# Patient Record
Sex: Male | Born: 1935 | Race: White | Hispanic: No | Smoking: Never smoker
Health system: Southern US, Community
[De-identification: ages and names within clinical notes are randomized; demographics above are authoritative.]

## PROBLEM LIST (undated history)

## (undated) DIAGNOSIS — I10 Essential (primary) hypertension: Secondary | ICD-10-CM

## (undated) DIAGNOSIS — I2510 Atherosclerotic heart disease of native coronary artery without angina pectoris: Secondary | ICD-10-CM

---

## 2006-07-07 ENCOUNTER — Emergency Department: Payer: Self-pay | Admitting: Emergency Medicine

## 2006-07-07 ENCOUNTER — Other Ambulatory Visit: Payer: Self-pay

## 2006-09-29 DIAGNOSIS — G4730 Sleep apnea, unspecified: Secondary | ICD-10-CM | POA: Insufficient documentation

## 2006-10-04 ENCOUNTER — Ambulatory Visit: Payer: Self-pay | Admitting: Internal Medicine

## 2006-10-04 ENCOUNTER — Other Ambulatory Visit: Payer: Self-pay

## 2006-10-04 DIAGNOSIS — Z8679 Personal history of other diseases of the circulatory system: Secondary | ICD-10-CM | POA: Insufficient documentation

## 2021-04-12 ENCOUNTER — Inpatient Hospital Stay
Admission: EM | Admit: 2021-04-12 | Discharge: 2021-04-15 | DRG: 281 | Disposition: A | Discharge status: Home Health | Payer: No Typology Code available for payment source | Attending: Internal Medicine | Admitting: Internal Medicine

## 2021-04-12 ENCOUNTER — Emergency Department: Payer: No Typology Code available for payment source

## 2021-04-12 ENCOUNTER — Other Ambulatory Visit: Payer: Self-pay

## 2021-04-12 ENCOUNTER — Encounter: Payer: Self-pay | Admitting: Emergency Medicine

## 2021-04-12 DIAGNOSIS — R4182 Altered mental status, unspecified: Secondary | ICD-10-CM | POA: Diagnosis present

## 2021-04-12 DIAGNOSIS — I2582 Chronic total occlusion of coronary artery: Secondary | ICD-10-CM | POA: Diagnosis present

## 2021-04-12 DIAGNOSIS — W19XXXA Unspecified fall, initial encounter: Secondary | ICD-10-CM

## 2021-04-12 DIAGNOSIS — I452 Bifascicular block: Secondary | ICD-10-CM | POA: Diagnosis present

## 2021-04-12 DIAGNOSIS — Z6833 Body mass index (BMI) 33.0-33.9, adult: Secondary | ICD-10-CM | POA: Diagnosis not present

## 2021-04-12 DIAGNOSIS — I25118 Atherosclerotic heart disease of native coronary artery with other forms of angina pectoris: Secondary | ICD-10-CM | POA: Diagnosis not present

## 2021-04-12 DIAGNOSIS — E538 Deficiency of other specified B group vitamins: Secondary | ICD-10-CM | POA: Diagnosis present

## 2021-04-12 DIAGNOSIS — I10 Essential (primary) hypertension: Secondary | ICD-10-CM | POA: Diagnosis present

## 2021-04-12 DIAGNOSIS — E785 Hyperlipidemia, unspecified: Secondary | ICD-10-CM | POA: Diagnosis present

## 2021-04-12 DIAGNOSIS — I214 Non-ST elevation (NSTEMI) myocardial infarction: Secondary | ICD-10-CM | POA: Diagnosis present

## 2021-04-12 DIAGNOSIS — F0390 Unspecified dementia without behavioral disturbance: Secondary | ICD-10-CM | POA: Diagnosis present

## 2021-04-12 DIAGNOSIS — Z20822 Contact with and (suspected) exposure to covid-19: Secondary | ICD-10-CM | POA: Diagnosis present

## 2021-04-12 DIAGNOSIS — Z79899 Other long term (current) drug therapy: Secondary | ICD-10-CM | POA: Diagnosis not present

## 2021-04-12 DIAGNOSIS — Z7982 Long term (current) use of aspirin: Secondary | ICD-10-CM | POA: Diagnosis not present

## 2021-04-12 DIAGNOSIS — E669 Obesity, unspecified: Secondary | ICD-10-CM | POA: Diagnosis present

## 2021-04-12 DIAGNOSIS — R296 Repeated falls: Secondary | ICD-10-CM | POA: Diagnosis present

## 2021-04-12 DIAGNOSIS — I252 Old myocardial infarction: Secondary | ICD-10-CM

## 2021-04-12 DIAGNOSIS — I2510 Atherosclerotic heart disease of native coronary artery without angina pectoris: Secondary | ICD-10-CM | POA: Diagnosis present

## 2021-04-12 DIAGNOSIS — I25810 Atherosclerosis of coronary artery bypass graft(s) without angina pectoris: Secondary | ICD-10-CM | POA: Diagnosis present

## 2021-04-12 DIAGNOSIS — M6282 Rhabdomyolysis: Secondary | ICD-10-CM | POA: Diagnosis present

## 2021-04-12 HISTORY — DX: Essential (primary) hypertension: I10

## 2021-04-12 HISTORY — DX: Atherosclerotic heart disease of native coronary artery without angina pectoris: I25.10

## 2021-04-12 LAB — CBC
HCT: 47.7 % (ref 39.0–52.0)
HCT: 47.8 % (ref 39.0–52.0)
Hemoglobin: 15.8 g/dL (ref 13.0–17.0)
Hemoglobin: 15.6 g/dL (ref 13.0–17.0)
MCH: 29.9 pg (ref 26.0–34.0)
MCH: 29.3 pg (ref 26.0–34.0)
MCHC: 33.1 g/dL (ref 30.0–36.0)
MCHC: 32.6 g/dL (ref 30.0–36.0)
MCV: 90.3 fL (ref 80.0–100.0)
MCV: 89.7 fL (ref 80.0–100.0)
Platelets: 165 10*3/uL (ref 150–400)
Platelets: 212 10*3/uL (ref 150–400)
RBC: 5.28 MIL/uL (ref 4.22–5.81)
RBC: 5.33 MIL/uL (ref 4.22–5.81)
RDW: 13.4 % (ref 11.5–15.5)
RDW: 13.3 % (ref 11.5–15.5)
WBC: 19.5 10*3/uL — ABNORMAL HIGH (ref 4.0–10.5)
WBC: 19 10*3/uL — ABNORMAL HIGH (ref 4.0–10.5)
nRBC: 0 % (ref 0.0–0.2)
nRBC: 0 % (ref 0.0–0.2)

## 2021-04-12 LAB — COMPREHENSIVE METABOLIC PANEL
ALT: 27 U/L (ref 0–44)
AST: 53 U/L — ABNORMAL HIGH (ref 15–41)
Albumin: 3.8 g/dL (ref 3.5–5.0)
Alkaline Phosphatase: 69 U/L (ref 38–126)
Anion gap: 8 (ref 5–15)
BUN: 15 mg/dL (ref 8–23)
CO2: 24 mmol/L (ref 22–32)
Calcium: 8.8 mg/dL — ABNORMAL LOW (ref 8.9–10.3)
Chloride: 104 mmol/L (ref 98–111)
Creatinine, Ser: 1.05 mg/dL (ref 0.61–1.24)
GFR, Estimated: >60 mL/min (ref >60)
Glucose, Bld: 111 mg/dL — ABNORMAL HIGH (ref 70–99)
Potassium: 3.5 mmol/L (ref 3.5–5.1)
Sodium: 136 mmol/L (ref 135–145)
Total Bilirubin: 1.1 mg/dL (ref 0.3–1.2)
Total Protein: 7 g/dL (ref 6.5–8.1)

## 2021-04-12 LAB — RESP PANEL BY RT-PCR (FLU A&B, COVID) ARPGX2
Influenza A by PCR: NEGATIVE
Influenza B by PCR: NEGATIVE
SARS Coronavirus 2 by RT PCR: NEGATIVE

## 2021-04-12 LAB — HEPARIN LEVEL (UNFRACTIONATED): Heparin Unfractionated: 0.1 IU/mL — ABNORMAL LOW (ref 0.30–0.70)

## 2021-04-12 LAB — TSH: TSH: 1.288 u[IU]/mL (ref 0.350–4.500)

## 2021-04-12 LAB — URINALYSIS, COMPLETE (UACMP) WITH MICROSCOPIC
Bacteria, UA: NONE SEEN
Bilirubin Urine: NEGATIVE
Glucose, UA: NEGATIVE mg/dL
Ketones, ur: 20 mg/dL — AB
Leukocytes,Ua: NEGATIVE
Nitrite: NEGATIVE
Protein, ur: NEGATIVE mg/dL
Specific Gravity, Urine: 1.018 (ref 1.005–1.030)
pH: 5 (ref 5.0–8.0)

## 2021-04-12 LAB — TROPONIN I (HIGH SENSITIVITY)
Troponin I (High Sensitivity): 1858 ng/L (ref <18)
Troponin I (High Sensitivity): 3752 ng/L (ref <18)

## 2021-04-12 LAB — PROTIME-INR
INR: 1.1 (ref 0.8–1.2)
Prothrombin Time: 14.5 seconds (ref 11.4–15.2)

## 2021-04-12 LAB — APTT: aPTT: 31 seconds (ref 24–36)

## 2021-04-12 LAB — FOLATE: Folate: 17 ng/mL (ref >5.9)

## 2021-04-12 LAB — VITAMIN B12: Vitamin B-12: 156 pg/mL — ABNORMAL LOW (ref 180–914)

## 2021-04-12 LAB — MAGNESIUM: Magnesium: 2.3 mg/dL (ref 1.7–2.4)

## 2021-04-12 LAB — CK: Total CK: 1150 U/L — ABNORMAL HIGH (ref 49–397)

## 2021-04-12 MED ORDER — ASPIRIN 81 MG PO CHEW
81.0000 mg | CHEWABLE_TABLET | Freq: Every day | ORAL | Status: DC
  Administered 2021-04-13 – 2021-04-15 (×3): 81 mg via ORAL
  Filled 2021-04-12 (×3): qty 1

## 2021-04-12 MED ORDER — ONDANSETRON HCL 4 MG/2ML IJ SOLN: 4.0000 mg | Freq: Four times a day (QID) | INTRAMUSCULAR | Status: DC | PRN

## 2021-04-12 MED ORDER — ASPIRIN EC 81 MG PO TBEC: 81.0000 mg | DELAYED_RELEASE_TABLET | Freq: Every day | ORAL | Status: DC

## 2021-04-12 MED ORDER — ALLOPURINOL 100 MG PO TABS
200.0000 mg | ORAL_TABLET | Freq: Every day | ORAL | Status: DC
  Administered 2021-04-14 – 2021-04-15 (×2): 200 mg via ORAL
  Filled 2021-04-12 (×4): qty 2

## 2021-04-12 MED ORDER — NITROGLYCERIN 0.4 MG SL SUBL: 0.4000 mg | SUBLINGUAL_TABLET | SUBLINGUAL | Status: DC | PRN

## 2021-04-12 MED ORDER — HYDROCHLOROTHIAZIDE 12.5 MG PO TABS
12.5000 mg | ORAL_TABLET | Freq: Every day | ORAL | Status: DC
  Administered 2021-04-13: 12.5 mg via ORAL
  Filled 2021-04-12: qty 1

## 2021-04-12 MED ORDER — DORZOLAMIDE HCL-TIMOLOL MAL 2-0.5 % OP SOLN
1.0000 [drp] | Freq: Two times a day (BID) | OPHTHALMIC | Status: DC
  Administered 2021-04-14 – 2021-04-15 (×3): 1 [drp] via OPHTHALMIC
  Filled 2021-04-12 (×2): qty 10

## 2021-04-12 MED ORDER — LATANOPROST 0.005 % OP SOLN
1.0000 [drp] | Freq: Every day | OPHTHALMIC | Status: DC
  Administered 2021-04-13: 1 [drp] via OPHTHALMIC
  Filled 2021-04-12: qty 2.5

## 2021-04-12 MED ORDER — HEPARIN (PORCINE) 25000 UT/250ML-% IV SOLN
1700.0000 [IU]/h | INTRAVENOUS | Status: DC
  Administered 2021-04-12: 1400 [IU]/h via INTRAVENOUS
  Filled 2021-04-12 (×2): qty 250

## 2021-04-12 MED ORDER — LISINOPRIL 20 MG PO TABS
40.0000 mg | ORAL_TABLET | Freq: Every day | ORAL | Status: DC
  Administered 2021-04-13 – 2021-04-15 (×3): 40 mg via ORAL
  Filled 2021-04-12: qty 2
  Filled 2021-04-12: qty 4
  Filled 2021-04-12: qty 2

## 2021-04-12 MED ORDER — SODIUM CHLORIDE 0.9 % IV SOLN: INTRAVENOUS | Status: DC

## 2021-04-12 MED ORDER — METOPROLOL TARTRATE 50 MG PO TABS
100.0000 mg | ORAL_TABLET | Freq: Two times a day (BID) | ORAL | Status: DC
  Administered 2021-04-13 – 2021-04-15 (×5): 100 mg via ORAL
  Filled 2021-04-12 (×5): qty 2

## 2021-04-12 MED ORDER — HEPARIN BOLUS VIA INFUSION
3200.0000 [IU] | Freq: Once | INTRAVENOUS | Status: AC
  Administered 2021-04-12: 3200 [IU] via INTRAVENOUS
  Filled 2021-04-12: qty 3200

## 2021-04-12 MED ORDER — HEPARIN BOLUS VIA INFUSION
4000.0000 [IU] | Freq: Once | INTRAVENOUS | Status: AC
  Administered 2021-04-12: 4000 [IU] via INTRAVENOUS
  Filled 2021-04-12: qty 4000

## 2021-04-12 MED ORDER — ASPIRIN 81 MG PO CHEW
324.0000 mg | CHEWABLE_TABLET | ORAL | Status: AC
  Administered 2021-04-12: 324 mg via ORAL
  Filled 2021-04-12: qty 4

## 2021-04-12 MED ORDER — ACETAMINOPHEN 325 MG PO TABS: 650.0000 mg | ORAL_TABLET | ORAL | Status: DC | PRN

## 2021-04-12 MED ORDER — ATORVASTATIN CALCIUM 20 MG PO TABS: 40.0000 mg | ORAL_TABLET | Freq: Every day | ORAL | Status: DC

## 2021-04-12 MED ORDER — ASPIRIN 300 MG RE SUPP: 300.0000 mg | RECTAL | Status: AC

## 2021-04-12 NOTE — ED Provider Notes (Signed)
[facility] Emergency Department Provider Note  Time seen: 10:54 AM  I have reviewed the triage vital signs and the nursing notes.   HISTORY  Chief Complaint Fall   HPI Steven Jacobson is a 87 y.o. male presents to the emergency department for confusion and weakness over the past 2 to 3 days.  According to the wife who is here with the patient, patient lives at home, no diagnosed dementia.  She states normally the patient is very mentally sharp however over the past 2 to 3 days he has been somewhat confused and has been much more weak having trouble getting off the couch, etc.  1:00 this morning patient had a fall essentially from the couch to the floor and they were unable to get the patient up so they brought him to the emergency department for further evaluation.  Here the patient is awake alert but he is confused, states the year is 1992 but was able to tell me he is in the hospital as well as his name.  Patient denies any complaints, negative review of systems including chest pain shortness of breath abdominal pain vomiting diarrhea or dysuria.   History reviewed. No pertinent past medical history.  There are no problems to display for this patient.   Prior to Admission medications   Not on File    Not on File  No family history on file.  Social History    Review of Systems Constitutional: Negative for fever. Cardiovascular: Negative for chest pain. Respiratory: Negative for shortness of breath. Gastrointestinal: Negative for abdominal pain, vomiting and diarrhea. Genitourinary: Negative for urinary compaints Musculoskeletal: Negative for musculoskeletal complaints Skin: Negative for skin complaints  Neurological: Negative for headache All other ROS negative, although likely limited secondary to altered mental status.  ____________________________________________   PHYSICAL EXAM:  VITAL SIGNS: ED Triage Vitals [04/12/21 0728]  Enc Vitals Group      BP 139/76     Pulse Rate 67     Resp 18     Temp 97.6 F (36.4 C)     Temp Source Oral     SpO2 99 %     Weight 260 lb (117.9 kg)     Height 6\' 2"  (1.88 m)     Head Circumference      Peak Flow      Pain Score 0     Pain Loc      Pain Edu?      Excl. in GC?     Constitutional: Awake alert oriented to person and place only.  Cooperative and pleasant currently but appears confused. Eyes: Normal exam ENT      Head: Normocephalic and atraumatic.      Mouth/Throat: Mucous membranes are moist. Cardiovascular: Normal rate, regular rhythm.  Respiratory: Normal respiratory effort without tachypnea nor retractions. Breath sounds are clear Gastrointestinal: Soft and nontender. No distention. Musculoskeletal: Nontender with normal range of motion in all extremities. Neurologic:  Normal speech and language. No gross focal neurologic deficits  Skin:  Skin is warm, dry and intact.  Psychiatric: Mood and affect are normal.   ____________________________________________    EKG  EKG viewed and interpreted by myself shows a normal sinus rhythm at 69 bpm with a widened QRS, left axis deviation, largely normal intervals with nonspecific ST changes.  No recent EKG for comparison, last EKG was 2008 (and was grossly abnormal at that time).  ____________________________________________    RADIOLOGY  Chest x-ray shows cardiomegaly. CT scan of the  C-spine is negative. CT scan head is negative.  ____________________________________________   INITIAL IMPRESSION / ASSESSMENT AND PLAN / ED COURSE  Pertinent labs & imaging results that were available during my care of the patient were reviewed by me and considered in my medical decision making (see chart for details).   Patient presents to the emergency department for confusion and generalized weakness/fatigue over the past 2 to 3 days.  CT scans are negative for acute abnormality.  Chest x-ray negative for acute abnormality.  Patient's lab  work however does show significant leukocytosis of 19,000 as well as a troponin of 1800 and a CK of 1100.  Patient denies any chest pain, EKG shows what appears to be a bundle branch block but no signs of acute MI.  Patient's care is largely been provided at the [facility] hospital however patient and wife both strongly wishes stay at this hospital and do not want to be transferred to the [place].  Given the patient's elevated troponin we will start on a heparin infusion.  Given his elevated CK we will start on a normal saline infusion.  Urinalysis is pending, COVID is pending.  Patient will require admission to the hospital service for further work-up and treatment.  Demonie Kassa was evaluated in Emergency Department on 04/12/2021 for the symptoms described in the history of present illness. He was evaluated in the context of the global COVID-19 pandemic, which necessitated consideration that the patient might be at risk for infection with the SARS-CoV-2 virus that causes COVID-19. Institutional protocols and algorithms that pertain to the evaluation of patients at risk for COVID-19 are in a state of rapid change based on information released by regulatory bodies including the CDC and federal and state organizations. These policies and algorithms were followed during the patient's care in the ED.  CRITICAL CARE Performed by: Minna Antis   Total critical care time: 30 minutes  Critical care time was exclusive of separately billable procedures and treating other patients.  Critical care was necessary to treat or prevent imminent or life-threatening deterioration.  Critical care was time spent personally by me on the following activities: development of treatment plan with patient and/or surrogate as well as nursing, discussions with consultants, evaluation of patient's response to treatment, examination of patient, obtaining history from patient or surrogate, ordering and performing treatments and interventions,  ordering and review of laboratory studies, ordering and review of radiographic studies, pulse oximetry and re-evaluation of patient's condition.  ____________________________________________   FINAL CLINICAL IMPRESSION(S) / ED DIAGNOSES  Altered mental status NSTEMI   Minna Antis, MD 04/12/21 1059

## 2021-04-12 NOTE — ED Notes (Signed)
Pt with RA O2 sats 87-88%. Sats go up to 92-93% when encouraged to deep breath. Pt placed on 2LNC. Sats 97% at this time.

## 2021-04-12 NOTE — Consult Note (Signed)
Cardiology Consultation:   Patient ID: Quinnlan Abruzzo MRN: [id number]; DOB: [birth year]  Admit date: 04/12/2021 Date of Consult: 04/12/2021  PCP:  [facility]   [facility] Providers Cardiologist:  None       Patient Profile:   Jaray Boliver is a 87 y.o. male with a hx of CAD s/p CABG in 1996 who is being seen 04/12/2021 for the evaluation of an elevated troponin at the request of Dr. Joylene Igo.  History of Present Illness:   Mr. Vanosdol presents today for evaluation of a fall. He has a h/o CAD s/p CABG at the [facility] in 1996 after presenting with a "small heart atttack". He did not have angina at that time. He has done fairly well. He denies chest pain or sob or syncope. He fell earlier and had altered mentation and was brought in for evaluation and subsequent HS troponin was 800 then 3700. He denies angina and wants to go home. He is certain he did not pass out but was too weak to get up after falling to the ground. He is fairly sedentary.    History reviewed. No pertinent past medical history.  History reviewed. No pertinent surgical history.   Home Medications:  Prior to Admission medications   Medication Sig Start Date End Date Taking? Authorizing Provider  allopurinol (ZYLOPRIM) 100 MG tablet Take 200 mg by mouth daily. To lower uric acid levels and prevent gout 11/23/20  Yes [provider]  aspirin 81 MG chewable tablet Chew 81 mg by mouth daily. 10/05/06  Yes [provider]  atorvastatin (LIPITOR) 80 MG tablet Take 40 mg by mouth at bedtime. 11/23/20  Yes [provider]  dorzolamide-timolol (COSOPT) 22.3-6.8 MG/ML ophthalmic solution Place 1 drop into both eyes 2 (two) times daily. 10/18/20  Yes [provider]  hydrochlorothiazide (HYDRODIURIL) 25 MG tablet Take 12.5 mg by mouth daily. 11/23/20  Yes [provider]  latanoprost (XALATAN) 0.005 % ophthalmic solution Place 1 drop into both eyes at bedtime. 10/18/20  Yes [provider]  lisinopril (ZESTRIL) 40 MG tablet Take 40 mg by mouth daily. 11/23/20  Yes [provider]  metoprolol tartrate (LOPRESSOR) 100 MG tablet Take 100 mg by mouth every 12 (twelve) hours. 11/23/20  Yes [provider]  nitroGLYCERIN (NITRODUR - DOSED IN MG/24 HR) 0.6 mg/hr patch Place 1 patch onto the skin. 11/23/20  Yes [provider]    Inpatient Medications: Scheduled Meds:  allopurinol  200 mg Oral Daily   aspirin  324 mg Oral NOW   Or   aspirin  300 mg Rectal NOW   aspirin  81 mg Oral Daily   [START ON 04/13/2021] aspirin EC  81 mg Oral Daily   atorvastatin  40 mg Oral QHS   dorzolamide-timolol  1 drop Both Eyes BID   heparin  4,000 Units Intravenous Once   hydrochlorothiazide  12.5 mg Oral Daily   latanoprost  1 drop Both Eyes QHS   lisinopril  40 mg Oral Daily   metoprolol tartrate  100 mg Oral Q12H   Continuous Infusions:  sodium chloride     heparin 1,400 Units/hr (04/12/21 1219)   PRN Meds: acetaminophen, nitroGLYCERIN, ondansetron (ZOFRAN) IV  Allergies:   Not on File  Social History:   Social History   Socioeconomic History   Marital status: Married    Spouse name: Not on file   Number of children: Not on file   Years of education: Not on file  Highest education level: Not on file  Occupational History   Not on file  Tobacco Use   Smoking status: Not on file   Smokeless tobacco: Not on file  Substance and Sexual Activity   Alcohol use: Not on file   Drug use: Not on file   Sexual activity: Not on file  Other Topics Concern   Not on file  Social History Narrative   Not on file   Social Determinants of Health   Financial Resource Strain: Not on file  Food Insecurity: Not on file  Transportation Needs: Not on file  Physical Activity: Not on file  Stress: Not on file  Social Connections: Not on file  Intimate Partner Violence: Not on file    Family History:   History reviewed. No pertinent family history. No  premature CAD  ROS:  Please see the history of present illness.   All other ROS reviewed and negative.     Physical Exam/Data:   Vitals:   04/12/21 1216 04/12/21 1217 04/12/21 1218 04/12/21 1224  BP:      Pulse: (!) 58 (!) 59 61 62  Resp: 15 16 16 13   Temp:      TempSrc:      SpO2: (!) 87% (!) 88% (!) 89% 97%  Weight:      Height:       No intake or output data in the 24 hours ending 04/12/21 1255 Last 3 Weights 04/12/2021  Weight (lbs) 260 lb  Weight (kg) 117.935 kg     Body mass index is 33.38 kg/m.  General:  Well nourished, well developed, elderly man, in no acute distress HEENT: normal  Neck: no JVD Vascular: No carotid bruits; Distal pulses 2+ bilaterally Cardiac:  normal S1, S2; RRR; no murmur  Lungs:  clear to auscultation bilaterally, no wheezing, rhonchi or rales  Abd: soft, nontender, no hepatomegaly  Ext: no edema Musculoskeletal:  No deformities, BUE and BLE strength normal and equal Skin: warm and dry  Neuro:  CNs 2-12 intact, no focal abnormalities noted Psych:  Normal affect   EKG:  The EKG was personally reviewed and demonstrates:   Telemetry:  Telemetry was personally reviewed and demonstrates:  nsr with PVC's  Relevant CV Studies: none  Laboratory Data:  High Sensitivity Troponin:   Recent Labs  Lab 04/12/21 0815 04/12/21 1031  TROPONINIHS 1,858* 3,752*     Chemistry Recent Labs  Lab 04/12/21 0815 04/12/21 1031  NA 136  --   K 3.5  --   CL 104  --   CO2 24  --   GLUCOSE 111*  --   BUN 15  --   CREATININE 1.05  --   CALCIUM 8.8*  --   MG  --  2.3  GFRNONAA >60  --   ANIONGAP 8  --     Recent Labs  Lab 04/12/21 0815  PROT 7.0  ALBUMIN 3.8  AST 53*  ALT 27  ALKPHOS 69  BILITOT 1.1   Lipids No results for input(s): CHOL, TRIG, HDL, LABVLDL, LDLCALC, CHOLHDL in the last 168 hours.  Hematology Recent Labs  Lab 04/12/21 0815  WBC 19.5*  RBC 5.28  HGB 15.8  HCT 47.7  MCV 90.3  MCH 29.9  MCHC 33.1  RDW 13.4  PLT  165   Thyroid No results for input(s): TSH, FREET4 in the last 168 hours.  BNPNo results for input(s): BNP, PROBNP in the last 168 hours.  DDimer No results for input(s): DDIMER  in the last 168 hours.   Radiology/Studies:  CT HEAD WO CONTRAST ( )  Result Date: 04/12/2021 CLINICAL DATA:  Head trauma EXAM: CT HEAD WITHOUT CONTRAST TECHNIQUE: Contiguous axial images were obtained from the base of the skull through the vertex without intravenous contrast. COMPARISON:  None. FINDINGS: Brain: No acute intracranial hemorrhage, mass effect, or herniation. No extra-axial fluid collections. No evidence of acute territorial infarct. No hydrocephalus. Patchy hypodensities in the periventricular and subcortical white matter, likely secondary to chronic microvascular ischemic changes. Mild cortical volume loss. Vascular: Calcified plaques in the carotid siphons. Skull: Normal. Negative for fracture or focal lesion. Sinuses/Orbits: No acute finding. Other: None. IMPRESSION: Chronic changes with no acute intracranial process identified. Electronically Signed   By: Jannifer Hick M.D.   On: 04/12/2021 10:23   CT Cervical Spine Wo Contrast  Result Date: 04/12/2021 CLINICAL DATA:  Trauma. EXAM: CT CERVICAL SPINE WITHOUT CONTRAST TECHNIQUE: Multidetector CT imaging of the cervical spine was performed without intravenous contrast. Multiplanar CT image reconstructions were also generated. COMPARISON:  None. FINDINGS: Alignment: Normal. Skull base and vertebrae: No acute fracture. No primary bone lesion or focal pathologic process. Soft tissues and spinal canal: No prevertebral fluid or swelling. No visible canal hematoma. Disc levels: Mild intervertebral disc space narrowing at C5-C6 and C6-C7. Upper chest: Unremarkable. Other: None. IMPRESSION: No acute fracture or malalignment identified in the cervical spine. Electronically Signed   By: Jannifer Hick M.D.   On: 04/12/2021 10:25   DG Chest Portable 1  View  Result Date: 04/12/2021 CLINICAL DATA:  Fall.  NSTEMI. EXAM: PORTABLE CHEST 1 VIEW COMPARISON:  07/07/2006 FINDINGS: Cardiomegaly and CABG changes again noted. Elevation of the RIGHT hemidiaphragm is unchanged. There is no evidence of focal airspace disease, pulmonary edema, suspicious pulmonary nodule/mass, pleural effusion, or pneumothorax. No acute bony abnormalities are identified. IMPRESSION: Cardiomegaly without evidence of acute cardiopulmonary disease. Electronically Signed   By: Harmon Pier M.D.   On: 04/12/2021 10:01     Assessment and Plan:   NSTEMI - his troponins are elevated though he is asymptomatic. I would continue IV heparin. Probable left heart cath tomorrow. As he is asymptomatic, no indication for nitrates. Keep HR and BP controlled as needed with beta blocker. Repeat 12 lead ECG Altered mentation - he appears to be oriented now though he is quite cantankerous arguing constantly with his wife and son. Fall - cannot rule out either a brady or tachy mediated. Continue on tele.    Risk Assessment/Risk Scores:     TIMI Risk Score for Unstable Angina or Non-ST Elevation MI:   The patient's TIMI risk score is  , which indicates a  % risk of all cause mortality, new or recurrent myocardial infarction or need for urgent revascularization in the next 14 days.   For questions or updates, please contact [facility] Please consult [email or web address] for contact info under    Signed, Lewayne Bunting, MD  04/12/2021 12:55 PM

## 2021-04-12 NOTE — Progress Notes (Signed)
ANTICOAGULATION CONSULT NOTE   Pharmacy Consult for IV heparin Indication: chest pain/ACS   Patient Measurements: Height: 6\' 2"  (188 cm) Weight: 117.9 kg (260 lb) IBW/kg (Calculated) : 82.2 Heparin Dosing Weight: 107.3 kg  Vital Signs: Temp: 97.6 F (36.4 C) (11/24 0728) Temp Source: Oral (11/24 0728) BP: 139/76 (11/24 0728) Pulse Rate: 67 (11/24 0728)  Labs: Recent Labs    04/12/21 0815  HGB 15.8  HCT 47.7  PLT 165  CREATININE 1.05  CKTOTAL 1,150*  TROPONINIHS 1,858*    Estimated Creatinine Clearance: 70.2 mL/min (by C-G formula based on SCr of 1.05 mg/dL).   Medical History: History reviewed. No pertinent past medical history.  No PTA anticoagulation per my chart review  Assessment: 87YOM presenting d/t altered mental status and fall at home. No apparent pertinent PMH. EKG showing bundle branch block without signs of MI, though troponins elevated 1858 x 1.  CBC stable (Hgb 15.8, Plts WNL). No bleeding per notes.  Goal of Therapy:  Heparin level 0.3-0.7 units/ml Monitor platelets by anticoagulation protocol: Yes   Plan:  Give 4,000 units bolus x 1 Start heparin infusion at 1400 units/hr 8-hour heparin level CBC daily   04/14/21, PharmD Pharmacy Resident  04/12/2021 11:11 AM

## 2021-04-12 NOTE — ED Triage Notes (Signed)
Pt in via EMS for fall. WHen pt asked what he was here for, pt responded "I didn't call ya'll" Pt denies falling, pt denies pain. Pt denies all sx's.

## 2021-04-12 NOTE — H&P (Addendum)
History and Physical    Steven Jacobson [id number] DOB: 03/14/1934 DOA: 04/12/2021  PCP: [facility] [place]   Patient coming from: Home  I have personally briefly reviewed patient's old medical records in [place]  Chief Complaint: Status post fall   Most of the history is obtained from patient's wife at the bedside. 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  HPI: Steven Jacobson is a 87 y.o. male with medical history significant for pretension, coronary artery disease who was brought into the ER by EMS for evaluation after he fell. Patient is unable to provide any history and his wife states that over the last couple of days he has had frequent falls and has become progressively weak but early on the morning of his admission the patient fell off the couch and she was unable to help him up so she called EMS.  She states that he has not been diagnosed with dementia and that prior to 2 days ago was in his usual state of health with him being awake, alert and oriented to person place and time. Per EMS he was combative when they tried to get him on the stretcher. During my evaluation patient is only oriented to person and place but not to time.  He is able to move all extremities.  He has bruises on his extremities consistent with a history of falls. I am unable to do a review of systems on this patient. Labs show sodium 136, potassium 3.5, chloride 104, bicarb 24, glucose 111, BUN 15, creatinine 1.05, calcium 8.8, alkaline phosphatase 69, albumin 3.8, AST 53, ALT 27, total protein 7.0, total CK1 150, troponin 1858, white count 19.5, hemoglobin 15.8, hematocrit 47.7, MCV 90.3, RDW 13.4, platelet count 165 Respiratory viral panel is pending CT scan of the head without contrast shows chronic changes with no acute intracranial process. Cervical spine CT shows no acute fracture or malalignment identified in the cervical spine. Chest x-ray shows cardiomegaly without  evidence of acute cardiopulmonary disease. Twelve-lead EKG shows normal sinus rhythm with sinus arrhythmia.  Left axis deviation.  Right bundle branch block. No prior EKG to compare with  ED Course: Patient is an 87 year old male who was brought into the ER by EMS for evaluation after he fell at home and was unable to get up. Patient is confused and is only oriented to person and place which family states is a new problem.  At his baseline he is oriented to person, place and time. He has a white count of 19,000 with no obvious source at this time.  UA is pending Total CK is elevated and his troponin is also elevated as well. Twelve-lead EKG does not show any acute findings. He will be admitted to the hospital for further evaluation.   Review of Systems: As per HPI otherwise all other systems reviewed and negative.    History reviewed. No pertinent past medical history.  History reviewed. No pertinent surgical history.   has no history on file for tobacco use, alcohol use, and drug use.  Not on File  History reviewed. No pertinent family history.  Patient unable to provide history at this time  Prior to Admission medications   Not on File    Physical Exam: Vitals:   04/12/21 1216 04/12/21 1217 04/12/21 1218 04/12/21 1224  BP:      Pulse: (!) 58 (!) 59 61 62  Resp: 15 16 16 13   Temp:  TempSrc:      SpO2: (!) 87% (!) 88% (!) 89% 97%  Weight:      Height:         Vitals:   04/12/21 1216 04/12/21 1217 04/12/21 1218 04/12/21 1224  BP:      Pulse: (!) 58 (!) 59 61 62  Resp: 15 16 16 13   Temp:      TempSrc:      SpO2: (!) 87% (!) 88% (!) 89% 97%  Weight:      Height:          Constitutional: Alert and oriented x 3 . Not in any apparent distress HEENT:      Head: Normocephalic and atraumatic.         Eyes: PERLA, EOMI, Conjunctivae are normal. Sclera is non-icteric.       Mouth/Throat: Mucous membranes are moist.       Neck: Supple with no signs of  meningismus. Cardiovascular: Regular rate and rhythm. No murmurs, gallops, or rubs. 2+ symmetrical distal pulses are present . No JVD. No LE edema Respiratory: Respiratory effort normal .Lungs sounds clear bilaterally. No wheezes, crackles, or rhonchi.  Gastrointestinal: Soft, non tender, and non distended with positive bowel sounds.  Genitourinary: No CVA tenderness. Musculoskeletal: Nontender with normal range of motion in all extremities. No cyanosis, or erythema of extremities. Neurologic:  Face is symmetric. Moving all extremities. No gross focal neurologic deficits.  Able to move all extremities Skin: Skin is warm, dry.  No rash or ulcers Psychiatric: Mood and affect are normal.   Labs on Admission: I have personally reviewed following labs and imaging studies  CBC: Recent Labs  Lab 04/12/21 0815  WBC 19.5*  HGB 15.8  HCT 47.7  MCV 90.3  PLT 165   Basic Metabolic Panel: Recent Labs  Lab 04/12/21 0815 04/12/21 1031  NA 136  --   K 3.5  --   CL 104  --   CO2 24  --   GLUCOSE 111*  --   BUN 15  --   CREATININE 1.05  --   CALCIUM 8.8*  --   MG  --  2.3   GFR: Estimated Creatinine Clearance: 70.2 mL/min (by C-G formula based on SCr of 1.05 mg/dL). Liver Function Tests: Recent Labs  Lab 04/12/21 0815  AST 53*  ALT 27  ALKPHOS 69  BILITOT 1.1  PROT 7.0  ALBUMIN 3.8   No results for input(s): LIPASE, AMYLASE in the last 168 hours. No results for input(s): AMMONIA in the last 168 hours. Coagulation Profile: No results for input(s): INR, PROTIME in the last 168 hours. Cardiac Enzymes: Recent Labs  Lab 04/12/21 0815  CKTOTAL 1,150*   BNP (last 3 results) No results for input(s): PROBNP in the last 8760 hours. HbA1C: No results for input(s): HGBA1C in the last 72 hours. CBG: No results for input(s): GLUCAP in the last 168 hours. Lipid Profile: No results for input(s): CHOL, HDL, LDLCALC, TRIG, CHOLHDL, LDLDIRECT in the last 72 hours. Thyroid Function  Tests: No results for input(s): TSH, T4TOTAL, FREET4, T3FREE, THYROIDAB in the last 72 hours. Anemia Panel: No results for input(s): VITAMINB12, FOLATE, FERRITIN, TIBC, IRON, RETICCTPCT in the last 72 hours. Urine analysis:    Component Value Date/Time   COLORURINE YELLOW (A) 04/12/2021 1031   APPEARANCEUR HAZY (A) 04/12/2021 1031   LABSPEC 1.018 04/12/2021 1031   PHURINE 5.0 04/12/2021 1031   GLUCOSEU NEGATIVE 04/12/2021 1031   HGBUR MODERATE (A) 04/12/2021 1031  BILIRUBINUR NEGATIVE 04/12/2021 1031   KETONESUR 20 (A) 04/12/2021 1031   PROTEINUR NEGATIVE 04/12/2021 1031   NITRITE NEGATIVE 04/12/2021 1031   LEUKOCYTESUR NEGATIVE 04/12/2021 1031    Radiological Exams on Admission: CT HEAD WO CONTRAST (5MM)  Result Date: 04/12/2021 CLINICAL DATA:  Head trauma EXAM: CT HEAD WITHOUT CONTRAST TECHNIQUE: Contiguous axial images were obtained from the base of the skull through the vertex without intravenous contrast. COMPARISON:  None. FINDINGS: Brain: No acute intracranial hemorrhage, mass effect, or herniation. No extra-axial fluid collections. No evidence of acute territorial infarct. No hydrocephalus. Patchy hypodensities in the periventricular and subcortical white matter, likely secondary to chronic microvascular ischemic changes. Mild cortical volume loss. Vascular: Calcified plaques in the carotid siphons. Skull: Normal. Negative for fracture or focal lesion. Sinuses/Orbits: No acute finding. Other: None. IMPRESSION: Chronic changes with no acute intracranial process identified. Electronically Signed   By: Ofilia Neas M.D.   On: 04/12/2021 10:23   CT Cervical Spine Wo Contrast  Result Date: 04/12/2021 CLINICAL DATA:  Trauma. EXAM: CT CERVICAL SPINE WITHOUT CONTRAST TECHNIQUE: Multidetector CT imaging of the cervical spine was performed without intravenous contrast. Multiplanar CT image reconstructions were also generated. COMPARISON:  None. FINDINGS: Alignment: Normal. Skull base  and vertebrae: No acute fracture. No primary bone lesion or focal pathologic process. Soft tissues and spinal canal: No prevertebral fluid or swelling. No visible canal hematoma. Disc levels: Mild intervertebral disc space narrowing at C5-C6 and C6-C7. Upper chest: Unremarkable. Other: None. IMPRESSION: No acute fracture or malalignment identified in the cervical spine. Electronically Signed   By: Ofilia Neas M.D.   On: 04/12/2021 10:25   DG Chest Portable 1 View  Result Date: 04/12/2021 CLINICAL DATA:  Fall.  NSTEMI. EXAM: PORTABLE CHEST 1 VIEW COMPARISON:  07/07/2006 FINDINGS: Cardiomegaly and CABG changes again noted. Elevation of the RIGHT hemidiaphragm is unchanged. There is no evidence of focal airspace disease, pulmonary edema, suspicious pulmonary nodule/mass, pleural effusion, or pneumothorax. No acute bony abnormalities are identified. IMPRESSION: Cardiomegaly without evidence of acute cardiopulmonary disease. Electronically Signed   By: Margarette Canada M.D.   On: 04/12/2021 10:01     Assessment/Plan Principal Problem:   NSTEMI (non-ST elevated myocardial infarction) ([facility]) Active Problems:   CAD (coronary artery disease)   Rhabdomyolysis   Essential hypertension   AMS (altered mental status)   Frequent falls      Non-ST elevation MI Patient presents for evaluation of frequent falls and confusion and found to have elevated troponin levels He denies having any chest pain or shortness of breath Patient has a known history of coronary artery disease and is status post CABG Continue heparin drip initiated in the ER Continue aspirin, statins and metoprolol.    Rhabdomyolysis Following multiple falls We will start patient on normal saline Repeat CK levels in a.m.    Altered mental status Unclear etiology No source of infection at this time to explain patient's delirium Will obtain dementia work-up  Rule out CVA. Will repeat CT HEAD to rule out a stroke     Frequent  falls Place patient on fall precautions PT eval when stable    Leukocytosis Unclear etiology Possible leukemoid reaction      DVT prophylaxis: Heparin Code Status: full code  Family Communication: Greater than 50% of time was spent discussing patient's condition and plan of care with his wife and son at the bedside.  All questions and concerns have been addressed.  They verbalized understanding and agree with the plan.  CODE  STATUS was discussed and he is a full code. Disposition Plan: Back to previous home environment Consults called: Cardiology  Status:At the time of admission, it appears that the appropriate admission status for this patient is inpatient. This is judged to be reasonable and necessary to provide the required intensity of service to ensure the patient's safety given the presenting symptoms, physical exam findings, and initial radiographic and laboratory data in the context of their comorbid conditions. Patient requires inpatient status due to high intensity of service, high risk for further deterioration and high frequency of surveillance required.     Collier Bullock MD [facility]     04/12/2021, 12:27 PM

## 2021-04-12 NOTE — Progress Notes (Signed)
ANTICOAGULATION CONSULT NOTE   Pharmacy Consult for IV heparin Indication: chest pain/ACS   Patient Measurements: Height: 6\' 2"  (188 cm) Weight: 117.9 kg (260 lb) IBW/kg (Calculated) : 82.2 Heparin Dosing Weight: 107.3 kg  Vital Signs: BP: 123/68 (11/24 2200) Pulse Rate: 62 (11/24 2200)  Labs: Recent Labs    04/12/21 0815 04/12/21 1031 04/12/21 1210 04/12/21 1443 04/12/21 2000  HGB 15.8  --   --  15.6  --   HCT 47.7  --   --  47.8  --   PLT 165  --   --  212  --   APTT  --   --  31  --   --   LABPROT  --   --  14.5  --   --   INR  --   --  1.1  --   --   HEPARINUNFRC  --   --   --   --  0.10*  CREATININE 1.05  --   --   --   --   CKTOTAL 1,150*  --   --   --   --   TROPONINIHS 1,858* 3,752*  --   --   --      Estimated Creatinine Clearance: 70.2 mL/min (by C-G formula based on SCr of 1.05 mg/dL).   Medical History: History reviewed. No pertinent past medical history.  No PTA anticoagulation per my chart review  Assessment: 87YOM presenting d/t altered mental status and fall at home. No apparent pertinent PMH. EKG showing bundle branch block without signs of MI, though troponins elevated 1858 x 1.  CBC stable (Hgb 15.8, Plts WNL). No bleeding per notes.  Goal of Therapy:  Heparin level 0.3-0.7 units/ml Monitor platelets by anticoagulation protocol: Yes   Date/time HL Comment 11/24 2000 0.10 Rate 1400 units/hr. Will increase by 3unts/kg  Plan:  Give  3200 units bolus x 1 Increase heparin infusion to 1700 units/hr Repeat heparin level 8 hrs after rate change CBC daily whiel on heparin drip   Monico Sudduth Rodriguez-Guzman PharmD, BCPS 04/12/2021 10:37 PM

## 2021-04-12 NOTE — ED Triage Notes (Signed)
Pt in via EMS from home with c/o fall. EMS reports pt fell around 0100 this am. Pt is altered in mental status and EMS is unaware of baseline. Pt was combative when EMS was trying to get him on the stretcher. 167/59, 96% 74HR 99.0 temp, FSBS 123

## 2021-04-13 ENCOUNTER — Inpatient Hospital Stay (HOSPITAL_COMMUNITY)
Admit: 2021-04-13 | Discharge: 2021-04-13 | Disposition: A | Discharge status: Home / Self Care | Payer: No Typology Code available for payment source | Attending: Cardiovascular Disease | Admitting: Cardiovascular Disease

## 2021-04-13 ENCOUNTER — Encounter: Payer: Self-pay | Admitting: Internal Medicine

## 2021-04-13 ENCOUNTER — Other Ambulatory Visit: Payer: Self-pay | Admitting: Internal Medicine

## 2021-04-13 ENCOUNTER — Encounter
Admission: EM | Disposition: A | Discharge status: Home Health | Payer: Self-pay | Source: Home / Self Care | Attending: Internal Medicine

## 2021-04-13 ENCOUNTER — Inpatient Hospital Stay: Payer: No Typology Code available for payment source

## 2021-04-13 DIAGNOSIS — R4182 Altered mental status, unspecified: Secondary | ICD-10-CM | POA: Diagnosis not present

## 2021-04-13 DIAGNOSIS — I2510 Atherosclerotic heart disease of native coronary artery without angina pectoris: Secondary | ICD-10-CM | POA: Diagnosis not present

## 2021-04-13 DIAGNOSIS — M6282 Rhabdomyolysis: Secondary | ICD-10-CM

## 2021-04-13 DIAGNOSIS — I25810 Atherosclerosis of coronary artery bypass graft(s) without angina pectoris: Secondary | ICD-10-CM | POA: Diagnosis not present

## 2021-04-13 DIAGNOSIS — I25118 Atherosclerotic heart disease of native coronary artery with other forms of angina pectoris: Secondary | ICD-10-CM | POA: Diagnosis not present

## 2021-04-13 DIAGNOSIS — I214 Non-ST elevation (NSTEMI) myocardial infarction: Secondary | ICD-10-CM

## 2021-04-13 DIAGNOSIS — I10 Essential (primary) hypertension: Secondary | ICD-10-CM

## 2021-04-13 DIAGNOSIS — R296 Repeated falls: Secondary | ICD-10-CM | POA: Diagnosis not present

## 2021-04-13 HISTORY — PX: LEFT HEART CATH AND CORS/GRAFTS ANGIOGRAPHY: CATH118250

## 2021-04-13 LAB — ECHOCARDIOGRAM COMPLETE
AR max vel: 4.21 cm2
AV Area VTI: 4.53 cm2
AV Area mean vel: 4 cm2
AV Mean grad: 4 mmHg
AV Peak grad: 7.8 mmHg
Ao pk vel: 1.4 m/s
Area-P 1/2: 4.12 cm2
Height: 74 in
MV VTI: 5.1 cm2
S' Lateral: 4.5 cm
Weight: 4160 oz

## 2021-04-13 LAB — BASIC METABOLIC PANEL
Anion gap: 5 (ref 5–15)
BUN: 17 mg/dL (ref 8–23)
CO2: 26 mmol/L (ref 22–32)
Calcium: 8.4 mg/dL — ABNORMAL LOW (ref 8.9–10.3)
Chloride: 104 mmol/L (ref 98–111)
Creatinine, Ser: 1.05 mg/dL (ref 0.61–1.24)
GFR, Estimated: >60 mL/min (ref >60)
Glucose, Bld: 92 mg/dL (ref 70–99)
Potassium: 4.3 mmol/L (ref 3.5–5.1)
Sodium: 135 mmol/L (ref 135–145)

## 2021-04-13 LAB — CBC
HCT: 46.6 % (ref 39.0–52.0)
Hemoglobin: 15.8 g/dL (ref 13.0–17.0)
MCH: 30.6 pg (ref 26.0–34.0)
MCHC: 33.9 g/dL (ref 30.0–36.0)
MCV: 90.3 fL (ref 80.0–100.0)
Platelets: 188 10*3/uL (ref 150–400)
RBC: 5.16 MIL/uL (ref 4.22–5.81)
RDW: 13.5 % (ref 11.5–15.5)
WBC: 16.2 10*3/uL — ABNORMAL HIGH (ref 4.0–10.5)
nRBC: 0 % (ref 0.0–0.2)

## 2021-04-13 LAB — LIPID PANEL
Cholesterol: 111 mg/dL (ref 0–200)
HDL: 37 mg/dL — ABNORMAL LOW (ref >40)
LDL Cholesterol: 60 mg/dL (ref 0–99)
Total CHOL/HDL Ratio: 3 RATIO
Triglycerides: 71 mg/dL (ref <150)
VLDL: 14 mg/dL (ref 0–40)

## 2021-04-13 LAB — HEMOGLOBIN A1C
Hgb A1c MFr Bld: 5.6 % (ref 4.8–5.6)
Mean Plasma Glucose: 114 mg/dL

## 2021-04-13 LAB — CK: Total CK: 832 U/L — ABNORMAL HIGH (ref 49–397)

## 2021-04-13 LAB — HEPARIN LEVEL (UNFRACTIONATED): Heparin Unfractionated: 0.68 IU/mL (ref 0.30–0.70)

## 2021-04-13 LAB — RPR: RPR Ser Ql: NONREACTIVE

## 2021-04-13 LAB — TROPONIN I (HIGH SENSITIVITY): Troponin I (High Sensitivity): 2674 ng/L (ref <18)

## 2021-04-13 SURGERY — LEFT HEART CATH AND CORS/GRAFTS ANGIOGRAPHY
Anesthesia: Moderate Sedation

## 2021-04-13 MED ORDER — SODIUM CHLORIDE 0.9 % WEIGHT BASED INFUSION: 3.0000 mL/kg/h | INTRAVENOUS | Status: DC

## 2021-04-13 MED ORDER — HYDRALAZINE HCL 20 MG/ML IJ SOLN: 10.0000 mg | INTRAMUSCULAR | Status: AC | PRN

## 2021-04-13 MED ORDER — SODIUM CHLORIDE 0.9% FLUSH: 3.0000 mL | Freq: Two times a day (BID) | INTRAVENOUS | Status: DC

## 2021-04-13 MED ORDER — LABETALOL HCL 5 MG/ML IV SOLN: 10.0000 mg | INTRAVENOUS | Status: AC | PRN

## 2021-04-13 MED ORDER — PERFLUTREN LIPID MICROSPHERE
1.0000 mL | INTRAVENOUS | Status: AC | PRN
Start: 2021-04-13 — End: 2021-04-13
  Administered 2021-04-13: 4 mL via INTRAVENOUS
  Filled 2021-04-13: qty 10

## 2021-04-13 MED ORDER — MIDAZOLAM HCL 2 MG/2ML IJ SOLN
INTRAMUSCULAR | Status: AC
  Filled 2021-04-13: qty 2

## 2021-04-13 MED ORDER — FUROSEMIDE 40 MG PO TABS
40.0000 mg | ORAL_TABLET | Freq: Every day | ORAL | Status: DC
  Administered 2021-04-13 – 2021-04-15 (×3): 40 mg via ORAL
  Filled 2021-04-13 (×3): qty 1

## 2021-04-13 MED ORDER — SODIUM CHLORIDE 0.9 % WEIGHT BASED INFUSION: 1.0000 mL/kg/h | INTRAVENOUS | Status: DC

## 2021-04-13 MED ORDER — LIDOCAINE HCL (PF) 1 % IJ SOLN
INTRAMUSCULAR | Status: DC | PRN
  Administered 2021-04-13: 2 mL

## 2021-04-13 MED ORDER — SODIUM CHLORIDE 0.9% FLUSH: 3.0000 mL | INTRAVENOUS | Status: DC | PRN

## 2021-04-13 MED ORDER — CLOPIDOGREL BISULFATE 75 MG PO TABS
75.0000 mg | ORAL_TABLET | Freq: Every day | ORAL | Status: DC
  Administered 2021-04-14 – 2021-04-15 (×2): 75 mg via ORAL
  Filled 2021-04-13 (×2): qty 1

## 2021-04-13 MED ORDER — IOHEXOL 350 MG/ML SOLN
INTRAVENOUS | Status: DC | PRN
  Administered 2021-04-13: 24 mL

## 2021-04-13 MED ORDER — VERAPAMIL HCL 2.5 MG/ML IV SOLN
INTRAVENOUS | Status: DC | PRN
  Administered 2021-04-13: 2.5 mg via INTRA_ARTERIAL

## 2021-04-13 MED ORDER — HEPARIN (PORCINE) IN NACL 1000-0.9 UT/500ML-% IV SOLN
INTRAVENOUS | Status: DC | PRN
  Administered 2021-04-13: 1000 mL

## 2021-04-13 MED ORDER — SODIUM CHLORIDE 0.9 % IV SOLN: 250.0000 mL | INTRAVENOUS | Status: DC | PRN

## 2021-04-13 MED ORDER — HEPARIN SODIUM (PORCINE) 1000 UNIT/ML IJ SOLN
INTRAMUSCULAR | Status: AC
  Filled 2021-04-13: qty 10

## 2021-04-13 MED ORDER — HEPARIN (PORCINE) IN NACL 1000-0.9 UT/500ML-% IV SOLN
INTRAVENOUS | Status: AC
  Filled 2021-04-13: qty 1000

## 2021-04-13 MED ORDER — SODIUM CHLORIDE 0.9% FLUSH
3.0000 mL | Freq: Two times a day (BID) | INTRAVENOUS | Status: DC
  Administered 2021-04-13 – 2021-04-15 (×5): 3 mL via INTRAVENOUS

## 2021-04-13 MED ORDER — ENOXAPARIN SODIUM 40 MG/0.4ML IJ SOSY
40.0000 mg | PREFILLED_SYRINGE | INTRAMUSCULAR | Status: DC
  Administered 2021-04-14 – 2021-04-15 (×2): 40 mg via SUBCUTANEOUS
  Filled 2021-04-13 (×3): qty 0.4

## 2021-04-13 MED ORDER — CLOPIDOGREL BISULFATE 75 MG PO TABS
300.0000 mg | ORAL_TABLET | Freq: Once | ORAL | Status: AC
Start: 2021-04-13 — End: 2021-04-13
  Administered 2021-04-13: 300 mg via ORAL
  Filled 2021-04-13: qty 4

## 2021-04-13 MED ORDER — FENTANYL CITRATE (PF) 100 MCG/2ML IJ SOLN
INTRAMUSCULAR | Status: AC
  Filled 2021-04-13: qty 2

## 2021-04-13 MED ORDER — LIDOCAINE HCL 1 % IJ SOLN
INTRAMUSCULAR | Status: AC
  Filled 2021-04-13: qty 20

## 2021-04-13 MED ORDER — VERAPAMIL HCL 2.5 MG/ML IV SOLN
INTRAVENOUS | Status: AC
  Filled 2021-04-13: qty 2

## 2021-04-13 MED ORDER — CYANOCOBALAMIN 1000 MCG/ML IJ SOLN
1000.0000 ug | INTRAMUSCULAR | Status: DC
Start: 2021-04-13 — End: 2021-04-14
  Filled 2021-04-13: qty 1

## 2021-04-13 MED ORDER — HEPARIN SODIUM (PORCINE) 1000 UNIT/ML IJ SOLN
INTRAMUSCULAR | Status: DC | PRN
  Administered 2021-04-13: 5000 [IU] via INTRAVENOUS

## 2021-04-13 SURGICAL SUPPLY — 15 items
CATH INFINITI 5 FR IM (CATHETERS) ×2 IMPLANT
CATH INFINITI 5 FR MPA2 (CATHETERS) ×2 IMPLANT
CATH INFINITI 5FR AL1 (CATHETERS) ×2 IMPLANT
CATH INFINITI 5FR JL4 (CATHETERS) ×2 IMPLANT
DEVICE RAD TR BAND REGULAR (VASCULAR PRODUCTS) ×2 IMPLANT
DRAPE BRACHIAL (DRAPES) ×2 IMPLANT
GLIDESHEATH SLEND SS 6F .021 (SHEATH) ×2 IMPLANT
GUIDEWIRE INQWIRE 1.5J.035X260 (WIRE) ×1 IMPLANT
INQWIRE 1.5J .035X260CM (WIRE) ×2
PACK CARDIAC CATH (CUSTOM PROCEDURE TRAY) ×2 IMPLANT
PANNUS RETENTION SYSTEM 2 PAD (MISCELLANEOUS) ×2 IMPLANT
PROTECTION STATION PRESSURIZED (MISCELLANEOUS) ×2
SET ATX SIMPLICITY (MISCELLANEOUS) ×2 IMPLANT
STATION PROTECTION PRESSURIZED (MISCELLANEOUS) ×1 IMPLANT
WIRE HITORQ VERSACORE ST 145CM (WIRE) ×2 IMPLANT

## 2021-04-13 NOTE — Progress Notes (Signed)
Patient tolerated meal tray post-cath without incident. Tolerates fluids well. TR band removal successful per protocol and clean bandage with gauze applied to site on left radial. Patient is able void using external catheter system. Awaiting call from receiving RN and will transport to floor.

## 2021-04-13 NOTE — Progress Notes (Signed)
*  PRELIMINARY RESULTS* Echocardiogram 2D Echocardiogram has been performed.  Steven Jacobson 04/13/2021, 12:04 PM

## 2021-04-13 NOTE — ED Notes (Signed)
Report to Lea, RN  

## 2021-04-13 NOTE — ED Notes (Signed)
Called lab for morning labs. They came around at [facility] but pt was gone for scans. So I was letting them know he was back

## 2021-04-13 NOTE — Progress Notes (Signed)
ANTICOAGULATION CONSULT NOTE   Pharmacy Consult for IV heparin Indication: chest pain/ACS   Patient Measurements: Height: 6\' 2"  (188 cm) Weight: 117.9 kg (260 lb) IBW/kg (Calculated) : 82.2 Heparin Dosing Weight: 107.3 kg  Vital Signs: BP: 146/86 (11/25 1057) Pulse Rate: 60 (11/25 1057)  Labs: Recent Labs    04/12/21 0815 04/12/21 1031 04/12/21 1210 04/12/21 1443 04/12/21 2000 04/13/21 1041  HGB 15.8  --   --  15.6  --  15.8  HCT 47.7  --   --  47.8  --  46.6  PLT 165  --   --  212  --  188  APTT  --   --  31  --   --   --   LABPROT  --   --  14.5  --   --   --   INR  --   --  1.1  --   --   --   HEPARINUNFRC  --   --   --   --  0.10* 0.68  CREATININE 1.05  --   --   --   --  1.05  CKTOTAL 1,150*  --   --   --   --  832*  TROPONINIHS 1,858* 3,752*  --   --   --   --      Estimated Creatinine Clearance: 70.2 mL/min (by C-G formula based on SCr of 1.05 mg/dL).   Medical History: Past Medical History:  Diagnosis Date   CAD (coronary artery disease)     No PTA anticoagulation per my chart review  Assessment: 87YOM presenting d/t altered mental status and fall at home. No apparent pertinent PMH. EKG showing bundle branch block without signs of MI, though troponins elevated 1858 x 1.  CBC stable (Hgb 15.8, Plts WNL). No bleeding per notes.  Goal of Therapy:  Heparin level 0.3-0.7 units/ml Monitor platelets by anticoagulation protocol: Yes   Date/time HL Comment 11/24 2000 0.10 Rate 1400 units/hr. Will increase by 3unts/kg 11/25 1041 0.68 Therapeutic x 1  Plan:  Continue heparin infusion to 1700 units/hr Repeat heparin in 8 hrs to confirm results CBC daily whiel on heparin drip   Dorathy Stallone Rodriguez-Guzman PharmD, BCPS 04/13/2021 11:20 AM

## 2021-04-13 NOTE — H&P (View-Only) (Signed)
Progress Note  Patient Name: Steven Jacobson Date of Encounter: 04/13/2021  Primary Cardiologist: New to [facility]  Subjective   No chest pain, dyspnea, palpitations, dizziness, presyncope, or syncope.  Mental status at baseline.  High-sensitivity troponin has trended to 3752.  He remains on heparin drip.  N.p.o. for LHC today.  Consult note on 11/24 pending signature.  Inpatient Medications    Scheduled Meds:  allopurinol  200 mg Oral Daily   aspirin  81 mg Oral Daily   atorvastatin  40 mg Oral QHS   dorzolamide-timolol  1 drop Both Eyes BID   hydrochlorothiazide  12.5 mg Oral Daily   latanoprost  1 drop Both Eyes QHS   lisinopril  40 mg Oral Daily   metoprolol tartrate  100 mg Oral Q12H   Continuous Infusions:  sodium chloride Stopped (04/12/21 2125)   heparin 1,700 Units/hr (04/12/21 2332)   PRN Meds: acetaminophen, nitroGLYCERIN, ondansetron (ZOFRAN) IV   Vital Signs    Vitals:   04/13/21 0600 04/13/21 0630 04/13/21 0700 04/13/21 1057  BP: 128/67 126/68 131/67 (!) 146/86  Pulse: 60 (!) 43 (!) 55 60  Resp: 17 13 13 16   Temp:      TempSrc:      SpO2: 93% 96% 97% 97%  Weight:      Height:        Intake/Output Summary (Last 24 hours) at 04/13/2021 1109 Last data filed at 04/13/2021 0022 Gross per 24 hour  Intake 211.18 ml  Output 300 ml  Net -88.82 ml   Filed Weights   04/12/21 0728  Weight: 117.9 kg    Telemetry    SR - Personally Reviewed  ECG    Sinus bradycardia, 52 bpm, RBBB, left anterior fascicular block, baseline artifact, nonspecific ST-T changes- Personally Reviewed  Physical Exam   GEN: No acute distress.   Neck: No JVD. Cardiac: RRR, no murmurs, rubs, or gallops.  Left radial pulse 2+. Respiratory: Clear to auscultation bilaterally.  GI: Soft, nontender, non-distended.   MS: No edema; No deformity. Neuro:  Alert and oriented x 3; Nonfocal.  Psych: Normal affect.  Labs    Chemistry Recent Labs  Lab 04/12/21 0815  NA 136   K 3.5  CL 104  CO2 24  GLUCOSE 111*  BUN 15  CREATININE 1.05  CALCIUM 8.8*  PROT 7.0  ALBUMIN 3.8  AST 53*  ALT 27  ALKPHOS 69  BILITOT 1.1  GFRNONAA >60  ANIONGAP 8     Hematology Recent Labs  Lab 04/12/21 0815 04/12/21 1443 04/13/21 1041  WBC 19.5* 19.0* 16.2*  RBC 5.28 5.33 5.16  HGB 15.8 15.6 15.8  HCT 47.7 47.8 46.6  MCV 90.3 89.7 90.3  MCH 29.9 29.3 30.6  MCHC 33.1 32.6 33.9  RDW 13.4 13.3 13.5  PLT 165 212 188    Cardiac EnzymesNo results for input(s): TROPONINI in the last 168 hours. No results for input(s): TROPIPOC in the last 168 hours.   BNPNo results for input(s): BNP, PROBNP in the last 168 hours.   DDimer No results for input(s): DDIMER in the last 168 hours.   Radiology    CT HEAD WO CONTRAST (04/15/21)  Result Date: 04/13/2021 IMPRESSION: 1. No acute intracranial abnormalities. 2. Mild cerebral atrophy with chronic microvascular ischemic changes in the cerebral white matter, as above. Electronically Signed   By: 04/15/2021 M.D.   On: 04/13/2021 07:28   CT HEAD WO CONTRAST (04/15/2021)  Result Date: 04/12/2021 IMPRESSION: Chronic  changes with no acute intracranial process identified. Electronically Signed   By: Delaney  Williams M.D.   On: 04/12/2021 10:23   CT Cervical Spine Wo Contrast  Result Date: 04/12/2021 IMPRESSION: No acute fracture or malalignment identified in the cervical spine. Electronically Signed   By: Delaney  Williams M.D.   On: 04/12/2021 10:25   DG Chest Portable 1 View  Result Date: 04/12/2021 IMPRESSION: Cardiomegaly without evidence of acute cardiopulmonary disease. Electronically Signed   By: Jeffrey  Hu M.D.   On: 04/12/2021 10:01    Cardiac Studies   Nuclear treadmill stress test 03/1995: IMAGE ANALYSIS:  Left Ventricle Analysis :                           Rest           PeakEjection Fraction       52%            54%Wall Motion Analysis     - High Anterolateral  Normal         -     - Low Anterolateral   Normal          -     - Apical              Normal         -     - Posterobasal        Normal         -     - Inferior            Normal         -     - High Posterolateral Normal         Normal     - Low Posterolateral  Normal         Normal     - Inferoapical        Mild hypo      Normal     - Septal              Normal         Normal   Right Ventricle Analysis: Normal    Reason for Termination : Leg fatigue   EKG Interpretation: Negative for ST segment changes with an inadequate heart rate   Additional Comments: None   CONCLUSIONS: No exercise induced ischemia  __________  CABG 03/1995: Coronary Artery   Graft Quality   Graft Source  1 Dist RCA        Good            Saphenous vein  2 OM1             Good            Saphenous vein  3 Dist LAD        Good            L-IMA   Patient Profile     87 y.o. male with history of three-vessel CABG at [facility] in 03/1995 with LIMA to LAD, SVG to OM, and SVG to distal RCA who has not required ischemic evaluation since, HTN, and HLD who we are seeing for NSTEMI.  Assessment & Plan    1. CAD s/p 3-vessel CABG in 1996 with NSTEMI: -Currently, without angina -ASA -Heparin gtt -Echo pending -NPO for LHC today -Check lipid panel and A1c for further risk stratification  2.  HTN: -Blood pressure mildly elevated in the   ER -PTA Lopressor, lisinopril, HCTZ  3.  HLD: -Check LDL with goal being less than 70 -PTA atorvastatin  4.  Altered mental status: -Of uncertain etiology -Appears back to baseline at this time -Further management per primary service  5.  Leukocytosis: -No obvious source of infection at this time -Possibly inflammatory in the setting of the above -Could consider cultures -Further management per primary service  6.  Rhabdomyolysis: -Patient denies fall -CK pending -IV fluids    Shared Decision Making/Informed Consent{  The risks [stroke (1 in 1000), death (1 in 1000), kidney failure [usually temporary] (1 in  500), bleeding (1 in 200), allergic reaction [possibly serious] (1 in 200)], benefits (diagnostic support and management of coronary artery disease) and alternatives of a cardiac catheterization were discussed in detail with Steven Jacobson and he is willing to proceed.   For questions or updates, please contact [facility] Please consult [email or web address] for contact info under Cardiology/STEMI.    Signed, Sheketa Ende, PA-C [facility] Pager: [phone] 04/13/2021, 11:09 AM  

## 2021-04-13 NOTE — Progress Notes (Addendum)
Progress Note  Patient Name: Steven Jacobson Date of Encounter: 04/13/2021  Primary Cardiologist: New to [facility]  Subjective   No chest pain, dyspnea, palpitations, dizziness, presyncope, or syncope.  Mental status at baseline.  High-sensitivity troponin has trended to 3752.  He remains on heparin drip.  N.p.o. for LHC today.  Consult note on 11/24 pending signature.  Inpatient Medications    Scheduled Meds:  allopurinol  200 mg Oral Daily   aspirin  81 mg Oral Daily   atorvastatin  40 mg Oral QHS   dorzolamide-timolol  1 drop Both Eyes BID   hydrochlorothiazide  12.5 mg Oral Daily   latanoprost  1 drop Both Eyes QHS   lisinopril  40 mg Oral Daily   metoprolol tartrate  100 mg Oral Q12H   Continuous Infusions:  sodium chloride Stopped (04/12/21 2125)   heparin 1,700 Units/hr (04/12/21 2332)   PRN Meds: acetaminophen, nitroGLYCERIN, ondansetron (ZOFRAN) IV   Vital Signs    Vitals:   04/13/21 0600 04/13/21 0630 04/13/21 0700 04/13/21 1057  BP: 128/67 126/68 131/67 (!) 146/86  Pulse: 60 (!) 43 (!) 55 60  Resp: 17 13 13 16   Temp:      TempSrc:      SpO2: 93% 96% 97% 97%  Weight:      Height:        Intake/Output Summary (Last 24 hours) at 04/13/2021 1109 Last data filed at 04/13/2021 0022 Gross per 24 hour  Intake 211.18 ml  Output 300 ml  Net -88.82 ml   Filed Weights   04/12/21 0728  Weight: 117.9 kg    Telemetry    SR - Personally Reviewed  ECG    Sinus bradycardia, 52 bpm, RBBB, left anterior fascicular block, baseline artifact, nonspecific ST-T changes- Personally Reviewed  Physical Exam   GEN: No acute distress.   Neck: No JVD. Cardiac: RRR, no murmurs, rubs, or gallops.  Left radial pulse 2+. Respiratory: Clear to auscultation bilaterally.  GI: Soft, nontender, non-distended.   MS: No edema; No deformity. Neuro:  Alert and oriented x 3; Nonfocal.  Psych: Normal affect.  Labs    Chemistry Recent Labs  Lab 04/12/21 0815  NA 136   K 3.5  CL 104  CO2 24  GLUCOSE 111*  BUN 15  CREATININE 1.05  CALCIUM 8.8*  PROT 7.0  ALBUMIN 3.8  AST 53*  ALT 27  ALKPHOS 69  BILITOT 1.1  GFRNONAA >60  ANIONGAP 8     Hematology Recent Labs  Lab 04/12/21 0815 04/12/21 1443 04/13/21 1041  WBC 19.5* 19.0* 16.2*  RBC 5.28 5.33 5.16  HGB 15.8 15.6 15.8  HCT 47.7 47.8 46.6  MCV 90.3 89.7 90.3  MCH 29.9 29.3 30.6  MCHC 33.1 32.6 33.9  RDW 13.4 13.3 13.5  PLT 165 212 188    Cardiac EnzymesNo results for input(s): TROPONINI in the last 168 hours. No results for input(s): TROPIPOC in the last 168 hours.   BNPNo results for input(s): BNP, PROBNP in the last 168 hours.   DDimer No results for input(s): DDIMER in the last 168 hours.   Radiology    CT HEAD WO CONTRAST (04/15/21)  Result Date: 04/13/2021 IMPRESSION: 1. No acute intracranial abnormalities. 2. Mild cerebral atrophy with chronic microvascular ischemic changes in the cerebral white matter, as above. Electronically Signed   By: 04/15/2021 M.D.   On: 04/13/2021 07:28   CT HEAD WO CONTRAST (04/15/2021)  Result Date: 04/12/2021 IMPRESSION: Chronic  changes with no acute intracranial process identified. Electronically Signed   By: Jannifer Hick M.D.   On: 04/12/2021 10:23   CT Cervical Spine Wo Contrast  Result Date: 04/12/2021 IMPRESSION: No acute fracture or malalignment identified in the cervical spine. Electronically Signed   By: Jannifer Hick M.D.   On: 04/12/2021 10:25   DG Chest Portable 1 View  Result Date: 04/12/2021 IMPRESSION: Cardiomegaly without evidence of acute cardiopulmonary disease. Electronically Signed   By: Harmon Pier M.D.   On: 04/12/2021 10:01    Cardiac Studies   Nuclear treadmill stress test 03/1995: IMAGE ANALYSIS:  Left Ventricle Analysis :                           Rest           PeakEjection Fraction       52%            54%Wall Motion Analysis     - High Anterolateral  Normal         -     - Low Anterolateral   Normal          -     - Apical              Normal         -     - Posterobasal        Normal         -     - Inferior            Normal         -     - High Posterolateral Normal         Normal     - Low Posterolateral  Normal         Normal     - Inferoapical        Mild hypo      Normal     - Septal              Normal         Normal   Right Ventricle Analysis: Normal    Reason for Termination : Leg fatigue   EKG Interpretation: Negative for ST segment changes with an inadequate heart rate   Additional Comments: None   CONCLUSIONS: No exercise induced ischemia  __________  CABG 03/1995: Coronary Artery   Graft Quality   Graft Source  1 Dist RCA        Good            Saphenous vein  2 OM1             Good            Saphenous vein  3 Dist LAD        Good            L-IMA   Patient Profile     87 y.o. male with history of three-vessel CABG at [facility] in 03/1995 with LIMA to LAD, SVG to OM, and SVG to distal RCA who has not required ischemic evaluation since, HTN, and HLD who we are seeing for NSTEMI.  Assessment & Plan    1. CAD s/p 3-vessel CABG in 1996 with NSTEMI: -Currently, without angina -ASA -Heparin gtt -Echo pending -NPO for LHC today -Check lipid panel and A1c for further risk stratification  2.  HTN: -Blood pressure mildly elevated in the  ER -PTA Lopressor, lisinopril, HCTZ  3.  HLD: -Check LDL with goal being less than 70 -PTA atorvastatin  4.  Altered mental status: -Of uncertain etiology -Appears back to baseline at this time -Further management per primary service  5.  Leukocytosis: -No obvious source of infection at this time -Possibly inflammatory in the setting of the above -Could consider cultures -Further management per primary service  6.  Rhabdomyolysis: -Patient denies fall -CK pending -IV fluids    Shared Decision Making/Informed Consent{  The risks [stroke (1 in 1000), death (1 in 1000), kidney failure [usually temporary] (1 in  500), bleeding (1 in 200), allergic reaction [possibly serious] (1 in 200)], benefits (diagnostic support and management of coronary artery disease) and alternatives of a cardiac catheterization were discussed in detail with [name] and he is willing to proceed.   For questions or updates, please contact [facility] Please consult [email or web address] for contact info under Cardiology/STEMI.    Signed, Eula Listen, PA-C [facility] Pager: [phone] 04/13/2021, 11:09 AM

## 2021-04-13 NOTE — Interval H&P Note (Signed)
History and Physical Interval Note:  04/13/2021 12:42 PM  Steven Jacobson  has presented today for surgery, with the diagnosis of Non-ST segment myocardial infarction.  The various methods of treatment have been discussed with the patient and family. After consideration of risks, benefits and other options for treatment, the patient has consented to  Procedure(s): LEFT HEART CATH AND CORS/GRAFTS ANGIOGRAPHY (N/A) as a surgical intervention.  The patient's history has been reviewed, patient examined, no change in status, stable for surgery.  I have reviewed the patient's chart and labs.  Questions were answered to the patient's satisfaction.    Cath Lab Visit (complete for each Cath Lab visit)  Clinical Evaluation Leading to the Procedure:   ACS: Yes.    Non-ACS:  N/A  [name]

## 2021-04-13 NOTE — ED Notes (Signed)
Rn to bedside to introduce self to pt. Pt in no acute distress.

## 2021-04-13 NOTE — Progress Notes (Signed)
Patient transported via bed to room 256. Patient is inquiring about his clothing that he came in with on EMS. Called the ED and spoke with Marylene Land, Consulting civil engineer. She will look around the room he came from and if nothing is located she will attempt to call EMS and see if they can locate or remember what the disposition of clothing is. Relayed this information to [employer], RN and Judeth Cornfield, Charity fundraiser on 2a.

## 2021-04-13 NOTE — ED Notes (Signed)
Called lab again. Lab never came and drew labs.

## 2021-04-13 NOTE — ED Notes (Signed)
Lab at bedside

## 2021-04-13 NOTE — Progress Notes (Signed)
Progress Note    Steven Jacobson  JHH:[id number] DOB: [birth year]  DOA: 04/12/2021 PCP: [facility]      Brief Narrative:    Medical records reviewed and are as summarized below:  Steven Jacobson is a 87 y.o. male with medical history significant for CAD s/p CABG, hypertension, who was brought to the hospital because of frequent falls at home.      Assessment/Plan:   Principal Problem:   NSTEMI (non-ST elevated myocardial infarction) (HCC) Active Problems:   CAD (coronary artery disease)   Rhabdomyolysis   Essential hypertension   AMS (altered mental status)   Frequent falls   Body mass index is 33.38 kg/m.  (Obesity)   Acute NSTEMI, CAD with history of CABG: Maximum troponin was 3,752.  S/p left heart cath which showed severe three-vessel CAD.  Medical therapy with dual antiplatelet therapy was recommended.  No coronary stent was placed.  He is off of IV heparin.  Vitamin B12 deficiency: Vitamin B12 level was 156.  Start vitamin B12 injections  Elevated CK: Probably from acute MI.  Frequent falls: PT and OT evaluation   Diet Order             Diet Heart Room service appropriate? Yes; Fluid consistency: Thin  Diet effective now                      Consultants: Cardiologist  Procedures: Left heart cath    Medications:    [MAR Hold] allopurinol  200 mg Oral Daily   [MAR Hold] aspirin  81 mg Oral Daily   [MAR Hold] atorvastatin  40 mg Oral QHS   clopidogrel  300 mg Oral Once   [START ON 04/14/2021] clopidogrel  75 mg Oral Q breakfast   [MAR Hold] cyanocobalamin  1,000 mcg Subcutaneous Once per day on Mon Wed Fri   [facility] Hold] dorzolamide-timolol  1 drop Both Eyes BID   [START ON 04/14/2021] enoxaparin (LOVENOX) injection  40 mg Subcutaneous Q24H   [MAR Hold] latanoprost  1 drop Both Eyes QHS   [MAR Hold] lisinopril  40 mg Oral Daily   [MAR Hold] metoprolol tartrate  100 mg Oral Q12H   sodium chloride flush  3 mL Intravenous Q12H    sodium chloride flush  3 mL Intravenous Q12H   Continuous Infusions:  sodium chloride     sodium chloride     [START ON 04/14/2021] sodium chloride     Followed by   Melene Muller ON 04/14/2021] sodium chloride       Anti-infectives (From admission, onward)    None              Family Communication/Anticipated D/C date and plan/Code Status   DVT prophylaxis: enoxaparin (LOVENOX) injection 40 mg Start: 04/14/21 1000     Code Status: Full Code  Family Communication: None Disposition Plan: Plan to discharge home in 1 to 2 days   Status is: Inpatient  Remains inpatient appropriate because: NSTEMI           Subjective:   Interval events noted.  He was seen in the hallway in the emergency room.  No chest pain or shortness of breath.  Objective:    Vitals:   04/13/21 1057 04/13/21 1205 04/13/21 1233 04/13/21 1350  BP: (!) 146/86 135/72  131/74  Pulse: 60 69  (!) 48  Resp: 16 14  14   Temp:  97.6 F (36.4 C)    TempSrc:  Oral  SpO2: 97% 95% 95% 100%  Weight:      Height:       No data found.   Intake/Output Summary (Last 24 hours) at 04/13/2021 1403 Last data filed at 04/13/2021 0022 Gross per 24 hour  Intake 211.18 ml  Output 300 ml  Net -88.82 ml   Filed Weights   04/12/21 0728  Weight: 117.9 kg    Exam:  GEN: NAD SKIN: No rash EYES: EOMI ENT: MMM CV: RRR PULM: CTA B ABD: soft, ND, NT, +BS CNS: AAO x 3, non focal EXT: No edema or tenderness        Data Reviewed:   I have personally reviewed following labs and imaging studies:  Labs: Labs show the following:   Basic Metabolic Panel: Recent Labs  Lab 04/12/21 0815 04/12/21 1031 04/13/21 1041  NA 136  --  135  K 3.5  --  4.3  CL 104  --  104  CO2 24  --  26  GLUCOSE 111*  --  92  BUN 15  --  17  CREATININE 1.05  --  1.05  CALCIUM 8.8*  --  8.4*  MG  --  2.3  --    GFR Estimated Creatinine Clearance: 70.2 mL/min (by C-G formula based on SCr of 1.05  mg/dL). Liver Function Tests: Recent Labs  Lab 04/12/21 0815  AST 53*  ALT 27  ALKPHOS 69  BILITOT 1.1  PROT 7.0  ALBUMIN 3.8   No results for input(s): LIPASE, AMYLASE in the last 168 hours. No results for input(s): AMMONIA in the last 168 hours. Coagulation profile Recent Labs  Lab 04/12/21 1210  INR 1.1    CBC: Recent Labs  Lab 04/12/21 0815 04/12/21 1443 04/13/21 1041  WBC 19.5* 19.0* 16.2*  HGB 15.8 15.6 15.8  HCT 47.7 47.8 46.6  MCV 90.3 89.7 90.3  PLT 165 212 188   Cardiac Enzymes: Recent Labs  Lab 04/12/21 0815 04/13/21 1041  CKTOTAL 1,150* 832*   BNP (last 3 results) No results for input(s): PROBNP in the last 8760 hours. CBG: No results for input(s): GLUCAP in the last 168 hours. D-Dimer: No results for input(s): DDIMER in the last 72 hours. Hgb A1c: No results for input(s): HGBA1C in the last 72 hours. Lipid Profile: Recent Labs    04/13/21 1041  CHOL 111  HDL 37*  LDLCALC 60  TRIG 71  CHOLHDL 3.0   Thyroid function studies: Recent Labs    04/12/21 1443  TSH 1.288   Anemia work up: Recent Labs    04/12/21 1443  VITAMINB12 156*  FOLATE 17.0   Sepsis Labs: Recent Labs  Lab 04/12/21 0815 04/12/21 1443 04/13/21 1041  WBC 19.5* 19.0* 16.2*    Microbiology Recent Results (from the past 240 hour(s))  Resp Panel by RT-PCR (Flu A&B, Covid) Urine, Clean Catch     Status: None   Collection Time: 04/12/21 10:31 AM   Specimen: Urine, Clean Catch; Nasopharyngeal(NP) swabs in vial transport medium  Result Value Ref Range Status   SARS Coronavirus 2 by RT PCR NEGATIVE NEGATIVE Final    Comment: (NOTE) SARS-CoV-2 target nucleic acids are NOT DETECTED.  The SARS-CoV-2 RNA is generally detectable in upper respiratory specimens during the acute phase of infection. The lowest concentration of SARS-CoV-2 viral copies this assay can detect is 138 copies/mL. A negative result does not preclude SARS-Cov-2 infection and should not be used  as the sole basis for treatment or other patient management decisions. A  negative result may occur with  improper specimen collection/handling, submission of specimen other than nasopharyngeal swab, presence of viral mutation(s) within the areas targeted by this assay, and inadequate number of viral copies(<138 copies/mL). A negative result must be combined with clinical observations, patient history, and epidemiological information. The expected result is Negative.  Fact Sheet for Patients:  BloggerCourse.com  Fact Sheet for Healthcare Providers:  SeriousBroker.it  This test is no t yet approved or cleared by the [place] FDA and  has been authorized for detection and/or diagnosis of SARS-CoV-2 by FDA under an Emergency Use Authorization (EUA). This EUA will remain  in effect (meaning this test can be used) for the duration of the COVID-19 declaration under Section 564(b)(1) of the Act, 21 U.S.C.section 360bbb-3(b)(1), unless the authorization is terminated  or revoked sooner.       Influenza A by PCR NEGATIVE NEGATIVE Final   Influenza B by PCR NEGATIVE NEGATIVE Final    Comment: (NOTE) The Xpert Xpress SARS-CoV-2/FLU/RSV plus assay is intended as an aid in the diagnosis of influenza from Nasopharyngeal swab specimens and should not be used as a sole basis for treatment. Nasal washings and aspirates are unacceptable for Xpert Xpress SARS-CoV-2/FLU/RSV testing.  Fact Sheet for Patients: BloggerCourse.com  Fact Sheet for Healthcare Providers: SeriousBroker.it  This test is not yet approved or cleared by the [place] FDA and has been authorized for detection and/or diagnosis of SARS-CoV-2 by FDA under an Emergency Use Authorization (EUA). This EUA will remain in effect (meaning this test can be used) for the duration of the COVID-19 declaration under Section 564(b)(1)  of the Act, 21 U.S.C. section 360bbb-3(b)(1), unless the authorization is terminated or revoked.  Performed at [facility], [address] [place], [place] [address]     Procedures and diagnostic studies:  CT HEAD WO CONTRAST ( )  Result Date: 04/13/2021 CLINICAL DATA:  87 year old male with history of delirium. EXAM: CT HEAD WITHOUT CONTRAST TECHNIQUE: Contiguous axial images were obtained from the base of the skull through the vertex without intravenous contrast. COMPARISON:  Head CT 04/12/2021. FINDINGS: Brain: Mild cerebral atrophy. Patchy and confluent areas of decreased attenuation are noted throughout the deep and periventricular white matter of the cerebral hemispheres bilaterally, compatible with chronic microvascular ischemic disease. No evidence of acute infarction, hemorrhage, hydrocephalus, extra-axial collection or mass lesion/mass effect. Vascular: No hyperdense vessel or unexpected calcification. Skull: Normal. Negative for fracture or focal lesion. Sinuses/Orbits: No acute finding. Large mucosal retention cyst or polyp in the right maxillary sinus. Mild multifocal mucosal thickening throughout the ethmoid sinuses bilaterally. Other: None. IMPRESSION: 1. No acute intracranial abnormalities. 2. Mild cerebral atrophy with chronic microvascular ischemic changes in the cerebral white matter, as above. Electronically Signed   By: Trudie Reed M.D.   On: 04/13/2021 07:28   CT HEAD WO CONTRAST ( )  Result Date: 04/12/2021 CLINICAL DATA:  Head trauma EXAM: CT HEAD WITHOUT CONTRAST TECHNIQUE: Contiguous axial images were obtained from the base of the skull through the vertex without intravenous contrast. COMPARISON:  None. FINDINGS: Brain: No acute intracranial hemorrhage, mass effect, or herniation. No extra-axial fluid collections. No evidence of acute territorial infarct. No hydrocephalus. Patchy hypodensities in the periventricular and subcortical white matter,  likely secondary to chronic microvascular ischemic changes. Mild cortical volume loss. Vascular: Calcified plaques in the carotid siphons. Skull: Normal. Negative for fracture or focal lesion. Sinuses/Orbits: No acute finding. Other: None. IMPRESSION: Chronic changes with no acute intracranial process identified. Electronically Signed  By: Jannifer Hick M.D.   On: 04/12/2021 10:23   CT Cervical Spine Wo Contrast  Result Date: 04/12/2021 CLINICAL DATA:  Trauma. EXAM: CT CERVICAL SPINE WITHOUT CONTRAST TECHNIQUE: Multidetector CT imaging of the cervical spine was performed without intravenous contrast. Multiplanar CT image reconstructions were also generated. COMPARISON:  None. FINDINGS: Alignment: Normal. Skull base and vertebrae: No acute fracture. No primary bone lesion or focal pathologic process. Soft tissues and spinal canal: No prevertebral fluid or swelling. No visible canal hematoma. Disc levels: Mild intervertebral disc space narrowing at C5-C6 and C6-C7. Upper chest: Unremarkable. Other: None. IMPRESSION: No acute fracture or malalignment identified in the cervical spine. Electronically Signed   By: Jannifer Hick M.D.   On: 04/12/2021 10:25   DG Chest Portable 1 View  Result Date: 04/12/2021 CLINICAL DATA:  Fall.  NSTEMI. EXAM: PORTABLE CHEST 1 VIEW COMPARISON:  07/07/2006 FINDINGS: Cardiomegaly and CABG changes again noted. Elevation of the RIGHT hemidiaphragm is unchanged. There is no evidence of focal airspace disease, pulmonary edema, suspicious pulmonary nodule/mass, pleural effusion, or pneumothorax. No acute bony abnormalities are identified. IMPRESSION: Cardiomegaly without evidence of acute cardiopulmonary disease. Electronically Signed   By: Harmon Pier M.D.   On: 04/12/2021 10:01               LOS: 1 day   Kaci Freel  [facility]   Pager on [email or web address]. If 7PM-7AM, please contact night-coverage at [email or web address]     04/13/2021, 2:03 PM

## 2021-04-14 DIAGNOSIS — R4182 Altered mental status, unspecified: Secondary | ICD-10-CM

## 2021-04-14 DIAGNOSIS — I25118 Atherosclerotic heart disease of native coronary artery with other forms of angina pectoris: Secondary | ICD-10-CM

## 2021-04-14 DIAGNOSIS — E538 Deficiency of other specified B group vitamins: Secondary | ICD-10-CM | POA: Diagnosis present

## 2021-04-14 DIAGNOSIS — I214 Non-ST elevation (NSTEMI) myocardial infarction: Secondary | ICD-10-CM | POA: Diagnosis not present

## 2021-04-14 LAB — CBC
HCT: 45.1 % (ref 39.0–52.0)
Hemoglobin: 15.3 g/dL (ref 13.0–17.0)
MCH: 30.3 pg (ref 26.0–34.0)
MCHC: 33.9 g/dL (ref 30.0–36.0)
MCV: 89.3 fL (ref 80.0–100.0)
Platelets: 193 10*3/uL (ref 150–400)
RBC: 5.05 MIL/uL (ref 4.22–5.81)
RDW: 13.6 % (ref 11.5–15.5)
WBC: 14.2 10*3/uL — ABNORMAL HIGH (ref 4.0–10.5)
nRBC: 0 % (ref 0.0–0.2)

## 2021-04-14 MED ORDER — VITAMIN B-12 1000 MCG PO TABS: 1000.0000 ug | ORAL_TABLET | Freq: Every day | ORAL | Status: DC

## 2021-04-14 MED ORDER — CLOPIDOGREL BISULFATE 75 MG PO TABS: 75.0000 mg | ORAL_TABLET | Freq: Every day | ORAL | 0 refills | Status: AC

## 2021-04-14 MED ORDER — CYANOCOBALAMIN 1000 MCG/ML IJ SOLN
1000.0000 ug | Freq: Every day | INTRAMUSCULAR | Status: DC
  Administered 2021-04-14 – 2021-04-15 (×2): 1000 ug via SUBCUTANEOUS
  Filled 2021-04-14 (×2): qty 1
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
## 2021-04-14 NOTE — Progress Notes (Signed)
Patient is very confused stated he is at home and non-compliant with safety measures to call for assistance when getting out of bed. Each time staff responded to the call bell patient was already up and unsteady. Patient moved to room 258 to be closer to the nurses station. Call bell kept within reach. Bed alarm on and functioning without any difficulties. Will continue to monitor and endorse.

## 2021-04-14 NOTE — Progress Notes (Addendum)
Progress Note    Steven Jacobson  EXN:[id number] DOB: [birth year]  DOA: 04/12/2021 PCP: [facility]      Brief Narrative:    Medical records reviewed and are as summarized below:  Steven Jacobson is a 87 y.o. male with medical history significant for CAD s/p CABG, hypertension, who was brought to the hospital because of frequent falls at home.      Assessment/Plan:   Principal Problem:   NSTEMI (non-ST elevated myocardial infarction) (HCC) Active Problems:   CAD (coronary artery disease)   Rhabdomyolysis   Essential hypertension   AMS (altered mental status)   Frequent falls   Vitamin B12 deficiency   Body mass index is 33.38 kg/m.  (Obesity)   Acute NSTEMI, CAD with history of CABG: Maximum troponin was 3,752.  S/p left heart cath which showed severe three-vessel CAD.  Medical therapy with dual antiplatelet therapy was recommended.  No coronary stent was placed.  He is off of IV heparin.  Continue aspirin and Plavix.  Vitamin B12 deficiency: Vitamin B12 level was 156.  Continue vitamin B12 injection  Elevated CK: Probably from acute MI.  Frequent falls: PT and OT evaluation  Dementia with intermittent confusion: Continue supportive care  I tried to reach his wife and son on the phone but call did not go through.  Patient told me that his wife has a problem with her phone  Diet Order             Diet - low sodium heart healthy           Diet Heart Room service appropriate? Yes; Fluid consistency: Thin  Diet effective now                      Consultants: Cardiologist  Procedures: Left heart cath    Medications:    allopurinol  200 mg Oral Daily   aspirin  81 mg Oral Daily   clopidogrel  75 mg Oral Q breakfast   cyanocobalamin  1,000 mcg Subcutaneous Daily   dorzolamide-timolol  1 drop Both Eyes BID   enoxaparin (LOVENOX) injection  40 mg Subcutaneous Q24H   furosemide  40 mg Oral Daily   latanoprost  1 drop Both Eyes QHS    lisinopril  40 mg Oral Daily   metoprolol tartrate  100 mg Oral Q12H   sodium chloride flush  3 mL Intravenous Q12H   Continuous Infusions:  sodium chloride       Anti-infectives (From admission, onward)    None              Family Communication/Anticipated D/C date and plan/Code Status   DVT prophylaxis: enoxaparin (LOVENOX) injection 40 mg Start: 04/14/21 1000     Code Status: Full Code  Family Communication: None Disposition Plan: Plan to discharge home in 1 to 2 days   Status is: Inpatient  Remains inpatient appropriate because: NSTEMI           Subjective:   Interval events noted.  No chest pain or shortness of breath.  Objective:    Vitals:   04/13/21 2154 04/14/21 0320 04/14/21 0900 04/14/21 1139  BP: 133/65 (!) 136/91 133/74 125/71  Pulse: 71 62 (!) 58 64  Resp: 20 20 18 18   Temp: 97.8 F (36.6 C) 98.2 F (36.8 C) 97.8 F (36.6 C) 98.5 F (36.9 C)  TempSrc: Oral Oral    SpO2: 99% 100% 97% 95%  Weight:  Height:       No data found.   Intake/Output Summary (Last 24 hours) at 04/14/2021 1616 Last data filed at 04/14/2021 1200 Gross per 24 hour  Intake 303 ml  Output 750 ml  Net -447 ml   Filed Weights   04/12/21 0728  Weight: 117.9 kg    Exam:  GEN: NAD SKIN: No rash EYES: EOMI ENT: MMM CV: RRR PULM: CTA B ABD: soft, ND, NT, +BS CNS: AAO x 3, non focal EXT: No edema or tenderness         Data Reviewed:   I have personally reviewed following labs and imaging studies:  Labs: Labs show the following:   Basic Metabolic Panel: Recent Labs  Lab 04/12/21 0815 04/12/21 1031 04/13/21 1041  NA 136  --  135  K 3.5  --  4.3  CL 104  --  104  CO2 24  --  26  GLUCOSE 111*  --  92  BUN 15  --  17  CREATININE 1.05  --  1.05  CALCIUM 8.8*  --  8.4*  MG  --  2.3  --    GFR Estimated Creatinine Clearance: 70.2 mL/min (by C-G formula based on SCr of 1.05 mg/dL). Liver Function Tests: Recent Labs  Lab  04/12/21 0815  AST 53*  ALT 27  ALKPHOS 69  BILITOT 1.1  PROT 7.0  ALBUMIN 3.8   No results for input(s): LIPASE, AMYLASE in the last 168 hours. No results for input(s): AMMONIA in the last 168 hours. Coagulation profile Recent Labs  Lab 04/12/21 1210  INR 1.1    CBC: Recent Labs  Lab 04/12/21 0815 04/12/21 1443 04/13/21 1041 04/14/21 0619  WBC 19.5* 19.0* 16.2* 14.2*  HGB 15.8 15.6 15.8 15.3  HCT 47.7 47.8 46.6 45.1  MCV 90.3 89.7 90.3 89.3  PLT 165 212 188 193   Cardiac Enzymes: Recent Labs  Lab 04/12/21 0815 04/13/21 1041  CKTOTAL 1,150* 832*   BNP (last 3 results) No results for input(s): PROBNP in the last 8760 hours. CBG: No results for input(s): GLUCAP in the last 168 hours. D-Dimer: No results for input(s): DDIMER in the last 72 hours. Hgb A1c: Recent Labs    04/13/21 1041  HGBA1C 5.6   Lipid Profile: Recent Labs    04/13/21 1041  CHOL 111  HDL 37*  LDLCALC 60  TRIG 71  CHOLHDL 3.0   Thyroid function studies: Recent Labs    04/12/21 1443  TSH 1.288   Anemia work up: Recent Labs    04/12/21 1443  VITAMINB12 156*  FOLATE 17.0   Sepsis Labs: Recent Labs  Lab 04/12/21 0815 04/12/21 1443 04/13/21 1041 04/14/21 0619  WBC 19.5* 19.0* 16.2* 14.2*    Microbiology Recent Results (from the past 240 hour(s))  Resp Panel by RT-PCR (Flu A&B, Covid) Urine, Clean Catch     Status: None   Collection Time: 04/12/21 10:31 AM   Specimen: Urine, Clean Catch; Nasopharyngeal(NP) swabs in vial transport medium  Result Value Ref Range Status   SARS Coronavirus 2 by RT PCR NEGATIVE NEGATIVE Final    Comment: (NOTE) SARS-CoV-2 target nucleic acids are NOT DETECTED.  The SARS-CoV-2 RNA is generally detectable in upper respiratory specimens during the acute phase of infection. The lowest concentration of SARS-CoV-2 viral copies this assay can detect is 138 copies/mL. A negative result does not preclude SARS-Cov-2 infection and should not be  used as the sole basis for treatment or other patient management decisions.  A negative result may occur with  improper specimen collection/handling, submission of specimen other than nasopharyngeal swab, presence of viral mutation(s) within the areas targeted by this assay, and inadequate number of viral copies(<138 copies/mL). A negative result must be combined with clinical observations, patient history, and epidemiological information. The expected result is Negative.  Fact Sheet for Patients:  BloggerCourse.com  Fact Sheet for Healthcare Providers:  SeriousBroker.it  This test is no t yet approved or cleared by the [place] FDA and  has been authorized for detection and/or diagnosis of SARS-CoV-2 by FDA under an Emergency Use Authorization (EUA). This EUA will remain  in effect (meaning this test can be used) for the duration of the COVID-19 declaration under Section 564(b)(1) of the Act, 21 U.S.C.section 360bbb-3(b)(1), unless the authorization is terminated  or revoked sooner.       Influenza A by PCR NEGATIVE NEGATIVE Final   Influenza B by PCR NEGATIVE NEGATIVE Final    Comment: (NOTE) The Xpert Xpress SARS-CoV-2/FLU/RSV plus assay is intended as an aid in the diagnosis of influenza from Nasopharyngeal swab specimens and should not be used as a sole basis for treatment. Nasal washings and aspirates are unacceptable for Xpert Xpress SARS-CoV-2/FLU/RSV testing.  Fact Sheet for Patients: BloggerCourse.com  Fact Sheet for Healthcare Providers: SeriousBroker.it  This test is not yet approved or cleared by the [place] FDA and has been authorized for detection and/or diagnosis of SARS-CoV-2 by FDA under an Emergency Use Authorization (EUA). This EUA will remain in effect (meaning this test can be used) for the duration of the COVID-19 declaration under Section  564(b)(1) of the Act, 21 U.S.C. section 360bbb-3(b)(1), unless the authorization is terminated or revoked.  Performed at [facility], [address] [place], [place] [address]     Procedures and diagnostic studies:  CT HEAD WO CONTRAST ( )  Result Date: 04/13/2021 CLINICAL DATA:  87 year old male with history of delirium. EXAM: CT HEAD WITHOUT CONTRAST TECHNIQUE: Contiguous axial images were obtained from the base of the skull through the vertex without intravenous contrast. COMPARISON:  Head CT 04/12/2021. FINDINGS: Brain: Mild cerebral atrophy. Patchy and confluent areas of decreased attenuation are noted throughout the deep and periventricular white matter of the cerebral hemispheres bilaterally, compatible with chronic microvascular ischemic disease. No evidence of acute infarction, hemorrhage, hydrocephalus, extra-axial collection or mass lesion/mass effect. Vascular: No hyperdense vessel or unexpected calcification. Skull: Normal. Negative for fracture or focal lesion. Sinuses/Orbits: No acute finding. Large mucosal retention cyst or polyp in the right maxillary sinus. Mild multifocal mucosal thickening throughout the ethmoid sinuses bilaterally. Other: None. IMPRESSION: 1. No acute intracranial abnormalities. 2. Mild cerebral atrophy with chronic microvascular ischemic changes in the cerebral white matter, as above. Electronically Signed   By: Trudie Reed M.D.   On: 04/13/2021 07:28   CARDIAC CATHETERIZATION  Result Date: 04/13/2021 Conclusions: Severe three vessel coronary artery disease, including chronic total occlusions of the ostial/proximal LAD and RCA, as well as 60% stenosis involving OM1 and severe diffuse distal LCx disease involving small terminal branches. Widely patent LIMA-LAD. Chronically occluded SVG-OM1. Severely diseased SVG-dRCA with 99% mid graft stenosis with TIMI-1 flow and occlusion of the distal graft (embolized debris versus competitive flow from  well-collateralized distal RCA). Mildly-moderately elevated left ventricular filling pressure (LVEDP 20-30 mmHg). Recommendations: Medical therapy of NSTEMI, including up to 12 months of dual antiplatelet therapy with aspirin and clopidogrel, as tolerated.  PCI to SVG-dRCA deferred due to robust collateralization of dRCA from LCx, absence  of angina, and poor distal graft outflow with high risk of microvascular embolization/occlusion. Gentle diuresis. Aggressive secondary prevention of coronary artery disease. Follow-up echo results. Findings and recommendations were discussed with Dr. Mariah Milling, as well as the patient and his wife. Yvonne Kendall, MD [facility]  ECHOCARDIOGRAM COMPLETE  Result Date: 04/13/2021    ECHOCARDIOGRAM REPORT   Patient Name:   ALANA DELO Date of Exam: 04/13/2021 Medical Rec #:  [id number]  Height:       74.0 in Accession #:    [id number] Weight:       260.0 lb Date of Birth:  [birth year]  BSA:          2.430 m Patient Age:    87 years   BP:           146/86 mmHg Patient Gender: M          HR:           71 bpm. Exam Location:  [facility] Procedure: 2D Echo, Color Doppler, Cardiac Doppler and Intracardiac            Opacification Agent Indications:     I21.4 NSTEMI  History:         Patient has no prior history of Echocardiogram examinations.                  CAD.  Sonographer:     Humphrey Rolls Referring Phys:  [id number] Antonieta Iba Diagnosing Phys: Julien Nordmann MD  Sonographer Comments: Suboptimal apical window and no subcostal window. Image acquisition challenging due to patient body habitus. IMPRESSIONS  1. Left ventricular ejection fraction, by estimation, is 50 to 55%. The left ventricle has low normal function. The left ventricle demonstrates regional wall motion abnormalities (septal wall hypokinesis in select images, possibly secondary to condution  abn and/or post operative state). Left ventricular diastolic parameters are consistent with Grade I diastolic dysfunction (impaired  relaxation).  2. Right ventricular systolic function is normal. The right ventricular size is normal. Tricuspid regurgitation signal is inadequate for assessing PA pressure.  3. Left atrial size was mildly dilated.  4. The mitral valve is normal in structure. Mild mitral valve regurgitation. No evidence of mitral stenosis.  5. The aortic valve is normal in structure. Aortic valve regurgitation is not visualized. Aortic valve sclerosis/calcification is present, without any evidence of aortic stenosis.  6. The inferior vena cava is normal in size with greater than 50% respiratory variability, suggesting right atrial pressure of 3 mmHg. FINDINGS  Left Ventricle: Left ventricular ejection fraction, by estimation, is 50 to 55%. The left ventricle has low normal function. The left ventricle demonstrates regional wall motion abnormalities. Definity contrast agent was given IV to delineate the left ventricular endocardial borders. The left ventricular internal cavity size was normal in size. There is no left ventricular hypertrophy. Left ventricular diastolic parameters are consistent with Grade I diastolic dysfunction (impaired relaxation). Right Ventricle: The right ventricular size is normal. No increase in right ventricular wall thickness. Right ventricular systolic function is normal. Tricuspid regurgitation signal is inadequate for assessing PA pressure. Left Atrium: Left atrial size was mildly dilated. Right Atrium: Right atrial size was normal in size. Pericardium: There is no evidence of pericardial effusion. Mitral Valve: The mitral valve is normal in structure. Mild mitral valve regurgitation. No evidence of mitral valve stenosis. MV peak gradient, 3.9 mmHg. The mean mitral valve gradient is 1.0 mmHg. Tricuspid Valve: The tricuspid valve is normal in structure. Tricuspid valve regurgitation is mild .  No evidence of tricuspid stenosis. Aortic Valve: The aortic valve is normal in structure. Aortic valve  regurgitation is not visualized. Aortic valve sclerosis/calcification is present, without any evidence of aortic stenosis. Aortic valve mean gradient measures 4.0 mmHg. Aortic valve peak  gradient measures 7.8 mmHg. Aortic valve area, by VTI measures 4.53 cm. Pulmonic Valve: The pulmonic valve was normal in structure. Pulmonic valve regurgitation is mild. No evidence of pulmonic stenosis. Aorta: The aortic root is normal in size and structure. Venous: The inferior vena cava is normal in size with greater than 50% respiratory variability, suggesting right atrial pressure of 3 mmHg. IAS/Shunts: No atrial level shunt detected by color flow Doppler.  LEFT VENTRICLE PLAX 2D LVIDd:         5.60 cm   Diastology LVIDs:         4.50 cm   LV e' medial:    6.31 cm/s LV PW:         1.20 cm   LV E/e' medial:  9.6 LV IVS:        0.80 cm   LV e' lateral:   9.03 cm/s LVOT diam:     2.30 cm   LV E/e' lateral: 6.7 LV SV:         111 LV SV Index:   46 LVOT Area:     4.15 cm  LEFT ATRIUM             Index LA diam:        4.40 cm 1.81 cm/m LA Vol (A2C):   64.3 ml 26.46 ml/m LA Vol (A4C):   61.8 ml 25.43 ml/m LA Biplane Vol: 65.5 ml 26.96 ml/m  AORTIC VALVE                    PULMONIC VALVE AV Area (Vmax):    4.21 cm     PV Vmax:          1.25 m/s AV Area (Vmean):   4.00 cm     PV Vmean:         80.900 cm/s AV Area (VTI):     4.53 cm     PV VTI:           0.239 m AV Vmax:           140.00 cm/s  PV Peak grad:     6.2 mmHg AV Vmean:          94.200 cm/s  PV Mean grad:     3.0 mmHg AV VTI:            0.246 m      PR End Diast Vel: 1.98 msec AV Peak Grad:      7.8 mmHg AV Mean Grad:      4.0 mmHg LVOT Vmax:         142.00 cm/s LVOT Vmean:        90.600 cm/s LVOT VTI:          0.268 m LVOT/AV VTI ratio: 1.09  AORTA Ao Root diam: 3.60 cm MITRAL VALVE MV Area (PHT): 4.12 cm    SHUNTS MV Area VTI:   5.10 cm    Systemic VTI:  0.27 m MV Peak grad:  3.9 mmHg    Systemic Diam: 2.30 cm MV Mean grad:  1.0 mmHg MV Vmax:       0.99 m/s MV  Vmean:      56.2 cm/s MV Decel Time: 184 msec MV E velocity:  60.80 cm/s MV A velocity: 84.80 cm/s MV E/A ratio:  0.72 Julien Nordmann MD Electronically signed by Julien Nordmann MD Signature Date/Time: 04/13/2021/2:03:59 PM    Final                LOS: 2 days   Tanner Vigna  [facility]   Pager on [email or web address]. If 7PM-7AM, please contact night-coverage at [email or web address]     04/14/2021, 4:16 PM

## 2021-04-15 DIAGNOSIS — I214 Non-ST elevation (NSTEMI) myocardial infarction: Secondary | ICD-10-CM | POA: Diagnosis not present

## 2021-04-15 DIAGNOSIS — R296 Repeated falls: Secondary | ICD-10-CM | POA: Diagnosis not present

## 2021-04-15 LAB — CBC
HCT: 43.9 % (ref 39.0–52.0)
Hemoglobin: 14.9 g/dL (ref 13.0–17.0)
MCH: 30.6 pg (ref 26.0–34.0)
MCHC: 33.9 g/dL (ref 30.0–36.0)
MCV: 90.1 fL (ref 80.0–100.0)
Platelets: 182 10*3/uL (ref 150–400)
RBC: 4.87 MIL/uL (ref 4.22–5.81)
RDW: 13.8 % (ref 11.5–15.5)
WBC: 11.2 10*3/uL — ABNORMAL HIGH (ref 4.0–10.5)
nRBC: 0 % (ref 0.0–0.2)

## 2021-04-15 NOTE — Evaluation (Signed)
Physical Therapy Evaluation Patient Details Name: Steven Jacobson MRN: [id number] DOB: 17-Apr-1934 Today's Date: 04/15/2021  History of Present Illness  Pt is an 87 y/o M admitted on 04/12/21 after being brought in to the hospital due to frequent falls at home. Pt noted to have elevated troponins & found to have NSTEMI. Pt is now s/p left heart cath which showed severe three-vessel CAD; medical therapy with dual antiplatelet therapy was recommended & no coronary stent was placed. PMH: CAD s/p CABG  Clinical Impression  Pt seen for PT evaluation with pt AxOx3 (not oriented to year, with pt reporting it's August 2023). PT educates pt on limiting weight bearing through LUE & pt agreeable & does decent throughout session. Pt with global weakness, no focal deficits appreciated. Pt requires CGA<>Min assist for transfers & gait without AD. Pt does demonstrate impaired gait pattern as noted below. Pt would benefit from acute PT services to progress gait & balance with mobility, as well as for stair negotiation.       Recommendations for follow up therapy are one component of a multi-disciplinary discharge planning process, led by the attending physician.  Recommendations may be updated based on patient status, additional functional criteria and insurance authorization.  Follow Up Recommendations Home health PT    Assistance Recommended at Discharge Intermittent Supervision/Assistance  Functional Status Assessment    Equipment Recommendations  Rolling walker (2 wheels)    Recommendations for Other Services       Precautions / Restrictions Precautions Precautions: Fall Precaution Comments: L radial cath on 11/25 Restrictions Weight Bearing Restrictions: No Other Position/Activity Restrictions: L radial cath on 11/25 minimize weight bearing through LUE      Mobility  Bed Mobility Overal bed mobility: Needs Assistance Bed Mobility: Supine to Sit     Supine to sit: Supervision;HOB elevated      General bed mobility comments: cuing to minimize use of LUE, used of bed rails    Transfers Overall transfer level: Needs assistance Equipment used: None Transfers: Sit to/from Stand Sit to Stand: Min guard;Min assist           General transfer comment: Pt does well at limiting use of LUE to push to standing    Ambulation/Gait Ambulation/Gait assistance: Min assist;Min guard Gait Distance (Feet):  (60 + 125 ft) Assistive device: None Gait Pattern/deviations: Decreased step length - left;Decreased step length - right;Decreased dorsiflexion - right;Decreased dorsiflexion - left;Shuffle;Decreased stride length Gait velocity: decreased     General Gait Details: Cuing but poor return demo to increase step length BLE. Initially upon standing pt requires UE support "to get my balance" then can ambulate without AD.  Stairs            Wheelchair Mobility    Modified Rankin (Stroke Patients Only)       Balance Overall balance assessment: Needs assistance Sitting-balance support: Feet supported;No upper extremity supported Sitting balance-Leahy Scale: Good Sitting balance - Comments: supervision static standing   Standing balance support: No upper extremity supported;During functional activity Standing balance-Leahy Scale: Fair Standing balance comment: ambulatory without AD with CGA<>min assist                             Pertinent Vitals/Pain Pain Assessment: No/denies pain    Home Living Family/patient expects to be discharged to:: Private residence Living Arrangements: Spouse/significant other;Children (son works during the day but has another son who flew in from [place] state & plans to stay  until pt is doing better) Available Help at Discharge: Family;Available 24 hours/day Type of Home: House Home Access: Stairs to enter Entrance Stairs-Rails: Doctor, general practice of Steps: 3   Home Layout: One level Home Equipment: None       Prior Function Prior Level of Function : Independent/Modified Independent             Mobility Comments: Independent without AD, denies falls       Hand Dominance        Extremity/Trunk Assessment   Upper Extremity Assessment Upper Extremity Assessment:  (L radial cath - educated pt to limit use of extremity)    Lower Extremity Assessment Lower Extremity Assessment: Generalized weakness       Communication   Communication: No difficulties  Cognition Arousal/Alertness: Awake/alert Behavior During Therapy: WFL for tasks assessed/performed Overall Cognitive Status: Within Functional Limits for tasks assessed                                          General Comments General comments (skin integrity, edema, etc.): Pt with continent BM on toilet, performing peri hygiene without assistance. HR 69-84 bpm during session.    Exercises     Assessment/Plan    PT Assessment Patient needs continued PT services  PT Problem List Decreased strength;Decreased mobility;Decreased safety awareness;Decreased balance;Decreased activity tolerance;Cardiopulmonary status limiting activity;Decreased knowledge of use of DME;Decreased knowledge of precautions       PT Treatment Interventions DME instruction;Therapeutic activities;Modalities;Gait training;Therapeutic exercise;Patient/family education;Stair training;Balance training;Functional mobility training;Neuromuscular re-education;Manual techniques    PT Goals (Current goals can be found in the Care Plan section)  Acute Rehab PT Goals Patient Stated Goal: go home, get therapy PT Goal Formulation: With patient Time For Goal Achievement: 04/29/21 Potential to Achieve Goals: Good    Frequency Min 2X/week   Barriers to discharge        Co-evaluation               AM-PAC PT "6 Clicks" Mobility  Outcome Measure Help needed turning from your back to your side while in a flat bed without using bedrails?: A  Little Help needed moving from lying on your back to sitting on the side of a flat bed without using bedrails?: A Little Help needed moving to and from a bed to a chair (including a wheelchair)?: A Little Help needed standing up from a chair using your arms (e.g., wheelchair or bedside chair)?: A Little Help needed to walk in hospital room?: A Little Help needed climbing 3-5 steps with a railing? : A Little 6 Click Score: 18    End of Session Equipment Utilized During Treatment: Gait belt Activity Tolerance: Patient tolerated treatment well Patient left: in chair;with chair alarm set;with call bell/phone within reach Nurse Communication: Mobility status PT Visit Diagnosis: Unsteadiness on feet (R26.81);Muscle weakness (generalized) (M62.81)    Time: 7416-3845 PT Time Calculation (min) (ACUTE ONLY): 19 min   Charges:   PT Evaluation $PT Eval Moderate Complexity: 1 Mod PT Treatments $Therapeutic Activity: 8-22 mins        Aleda Grana, PT, DPT 04/15/21, 11:48 AM   Sandi Mariscal 04/15/2021, 11:47 AM

## 2021-04-15 NOTE — Evaluation (Signed)
Occupational Therapy Evaluation Patient Details Name: Steven Jacobson MRN: [id number] DOB: 08-16-1933 Today's Date: 04/15/2021   History of Present Illness Pt is an 87 y/o M admitted on 04/12/21 after being brought in to the hospital due to frequent falls at home. Pt noted to have elevated troponins & found to have NSTEMI. Pt is now s/p left heart cath which showed severe three-vessel CAD; medical therapy with dual antiplatelet therapy was recommended & no coronary stent was placed. PMH: CAD s/p CABG, HTN   Clinical Impression   Pt seen for OT evaluation this date. Upon arrival to room, pt awake and seated upright in recliner. Pt oriented to self, place, month & day (stated year was 2023), and some aspects of situation (states he had a heart attack, but does not remember falling). Pt reports that at baseline he is independent with ADLs and functional mobility, living in a 1-story home with wife and son. No family member present to determine baseline cognitive functioning, however per chart review, pt is typically A&Ox3. Pt currently requires SUPERVISION for seated UB ADLs, MIN GUARD to don/doff socks while seated, SUPERVISION for applying deodorant while standing, and SUPERVISION for functional mobility of short household distances (20 ft) with RW due to current functional impairments (See OT Problem List below). Pt would benefit from additional skilled OT services to maximize return to PLOF and minimize risk of future falls, injury, caregiver burden, and readmission. Upon discharge, recommend HHOT services.         Recommendations for follow up therapy are one component of a multi-disciplinary discharge planning process, led by the attending physician.  Recommendations may be updated based on patient status, additional functional criteria and insurance authorization.   Follow Up Recommendations  Home health OT    Assistance Recommended at Discharge Intermittent Supervision/Assistance  Functional  Status Assessment  Patient has had a recent decline in their functional status and demonstrates the ability to make significant improvements in function in a reasonable and predictable amount of time.  Equipment Recommendations  None recommended by OT       Precautions / Restrictions Precautions Precautions: Fall Precaution Comments: L radial cath on 11/25 Restrictions Weight Bearing Restrictions: No Other Position/Activity Restrictions: L radial cath on 11/25 minimize weight bearing through LUE      Mobility Bed Mobility     General bed mobility comments: not assessed; pt in recliner at beginning/end of session    Transfers Overall transfer level: Needs assistance Equipment used: Rolling walker (2 wheels) Transfers: Sit to/from Stand Sit to Stand: Min guard              Balance Overall balance assessment: Needs assistance Sitting-balance support: Feet supported;No upper extremity supported Sitting balance-Leahy Scale: Good Sitting balance - Comments: good sitting balance reaching within BOS for seated ADLs   Standing balance support: No upper extremity supported;During functional activity Standing balance-Leahy Scale: Fair Standing balance comment: requires SUPERVISION for standing grooming/hygiene tasks                           ADL either performed or assessed with clinical judgement   ADL Overall ADL's : Needs assistance/impaired     Grooming: Applying deodorant;Supervision/safety;Standing;Wash/dry face;Oral care;Set up;Sitting           Upper Body Dressing : Supervision/safety;Set up;Sitting Upper Body Dressing Details (indicate cue type and reason): to don/doff hospital gown Lower Body Dressing: Min guard;Sitting/lateral leans Lower Body Dressing Details (indicate cue type and reason):  to don/doff socks             Functional mobility during ADLs: Supervision/safety;Rolling walker (2 wheels)       Vision Baseline Vision/History: 3  Glaucoma Ability to See in Adequate Light: 0 Adequate Patient Visual Report: No change from baseline              Pertinent Vitals/Pain Pain Assessment: No/denies pain     Hand Dominance     Extremity/Trunk Assessment Upper Extremity Assessment Upper Extremity Assessment: Overall WFL for tasks assessed;LUE deficits/detail LUE Deficits / Details: Unable to fully assess d/t L radial cath on 11/25 (protocol: to minimize weight bearing through LUE), however WFL for tasks assessed   Lower Extremity Assessment Lower Extremity Assessment: Generalized weakness       Communication Communication Communication: No difficulties   Cognition Arousal/Alertness: Awake/alert Behavior During Therapy: WFL for tasks assessed/performed Overall Cognitive Status: No family/caregiver present to determine baseline cognitive functioning                                 General Comments: Pt alert and oriented to self, place, month & day (stated year was 2023), and some aspects of situation. Pt reporting hx of falls different than reports in chart review. Per chart review, pt is typically A&Ox3     General Comments  Pt with continent BM on toilet, performing peri hygiene without assistance. HR 69-84 bpm during session.            Home Living Family/patient expects to be discharged to:: Private residence Living Arrangements: Spouse/significant other;Children (son works during the day but has another son who flew in from [place] state & plans to stay until pt is doing better) Available Help at Discharge: Family;Available 24 hours/day Type of Home: House Home Access: Stairs to enter [employer] of Steps: 3 Entrance Stairs-Rails: Right;Left Home Layout: One level     Bathroom Shower/Tub: Walk-in shower;Tub/shower unit         Home Equipment: Grab bars - tub/shower          Prior Functioning/Environment Prior Level of Function : Independent/Modified  Independent             Mobility Comments: Pt reports being independent without AD. Pt denies falls, however chart review reveals pt has had recent falls ADLs Comments: Pt reports being independent with ADLs        OT Problem List: Decreased strength;Impaired balance (sitting and/or standing);Decreased cognition      OT Treatment/Interventions: Self-care/ADL training;Therapeutic exercise;Energy conservation;Therapeutic activities;Patient/family education;Balance training    OT Goals(Current goals can be found in the care plan section) Acute Rehab OT Goals Patient Stated Goal: to get washed up OT Goal Formulation: With patient Time For Goal Achievement: 04/29/21 Potential to Achieve Goals: Good ADL Goals Pt Will Perform Grooming: with modified independence;standing (performing at least 3 standing grooming tasks) Pt Will Perform Lower Body Bathing: with set-up;with supervision;sit to/from stand Pt Will Transfer to Toilet: with modified independence;ambulating;regular height toilet  OT Frequency: Min 2X/week    AM-PAC OT "6 Clicks" Daily Activity     Outcome Measure Help from another person eating meals?: None Help from another person taking care of personal grooming?: A Little Help from another person toileting, which includes using toliet, bedpan, or urinal?: A Little Help from another person bathing (including washing, rinsing, drying)?: A Little Help from another person to put on and taking off regular upper body  clothing?: A Little Help from another person to put on and taking off regular lower body clothing?: A Little 6 Click Score: 19   End of Session Equipment Utilized During Treatment: Rolling walker (2 wheels) Nurse Communication: Mobility status  Activity Tolerance: Patient tolerated treatment well Patient left: in chair;with call bell/phone within reach;with chair alarm set  OT Visit Diagnosis: History of falling (Z91.81)                Time: 1140-1159 OT Time  Calculation (min): 19 min Charges:  OT General Charges $OT Visit: 1 Visit OT Evaluation $OT Eval Moderate Complexity: 1 Mod OT Treatments $Self Care/Home Management : 8-22 mins  Matthew Folks, OTR/L [facility] [phone]

## 2021-04-15 NOTE — TOC Initial Note (Signed)
Transition of Care [facility]) - Initial/Assessment Note    Patient Details  Name: Steven Jacobson MRN: [id number] Date of Birth: 1933/12/25  [facility]) CM/SW Contact:    Liliana Cline, LCSW Phone Number: 04/15/2021, 3:34 PM  Clinical Narrative:            Consulted with RN, Pharmacy, [facility] Supervisor Darl Pikes, patient (at bedside) and patient's son Barbara Cower for DC planning. Patient is being DC today. Patient lives with wife. According to Barbara Cower, wife has Dementia. PCP and Pharmacy is [facility].  Confirmed home address. Son Barbara Cower is flying in from [place] to help out and will be here late tomorrow.  Provided pants, sweatshirt, underwear, socks, and shoes per patient request.  Provided charity RW to avoid delay in DC. Wife and other son to pick patient up this evening. Asked RN to make sure to send copy of DC Summary and AVS with patient per Jason's request.  Pharmacy to provide enough for tomorrow's home medications and Barbara Cower will pick up new meds from [facility] on Tuesday when he is in town.      Emailed resources to [employer].  No additional needs identified.    Expected Discharge Plan: Home w Home Health Services Barriers to Discharge: Barriers Resolved   Patient Goals and CMS Choice Patient states their goals for this hospitalization and ongoing recovery are:: home with home health CMS Medicare.gov Compare Post Acute Care list provided to:: Patient Choice offered to / list presented to : Patient, Adult Children  Expected Discharge Plan and Services Expected Discharge Plan: Home w Home Health Services       Living arrangements for the past 2 months: Single Family Home Expected Discharge Date: 04/15/21               DME Arranged: Dan Humphreys rolling         HH Arranged: PT, OT HH Agency: [facility] Date HH Agency Contacted: 04/15/21   Representative spoke with at [facility] Agency: Barbara Cower  Prior Living Arrangements/Services Living arrangements for the past 2  months: Single Family Home Lives with:: Spouse, Adult Children Patient language and need for interpreter reviewed:: Yes Do you feel safe going back to the place where you live?: Yes      Need for Family Participation in Patient Care: Yes (Comment) Care giver support system in place?: Yes (comment)   Criminal Activity/Legal Involvement Pertinent to Current Situation/Hospitalization: No - Comment as needed  Activities of Daily Living Home Assistive Devices/Equipment: None ADL Screening (condition at time of admission) Patient's cognitive ability adequate to safely complete daily activities?: Yes Is the patient deaf or have difficulty hearing?: No Does the patient have difficulty seeing, even when wearing glasses/contacts?: No Does the patient have difficulty concentrating, remembering, or making decisions?: No Patient able to express need for assistance with ADLs?: Yes Does the patient have difficulty dressing or bathing?: No Independently performs ADLs?: Yes (appropriate for developmental age) Does the patient have difficulty walking or climbing stairs?: No Weakness of Legs: None Weakness of Arms/Hands: None  Permission Sought/Granted Permission sought to share information with : Oceanographer granted to share information with : Yes, Verbal Permission Granted     Permission granted to share info w AGENCY: [employer], [facility] agencies        Emotional Assessment       Orientation: : Oriented to Self, Oriented to  Time, Oriented to Place, Oriented to Situation Alcohol / Substance Use: Not Applicable Psych Involvement:  No (comment)  Admission diagnosis:  NSTEMI (non-ST elevated myocardial infarction) (HCC) [I21.4] Fall, initial encounter [W19.XXXA] Altered mental status, unspecified altered mental status type [R41.82] Patient Active Problem List   Diagnosis Date Noted   Vitamin B12 deficiency 04/14/2021   NSTEMI (non-ST elevated myocardial infarction)  (HCC) 04/12/2021   CAD (coronary artery disease) 04/12/2021   Rhabdomyolysis 04/12/2021   Essential hypertension 04/12/2021   AMS (altered mental status) 04/12/2021   Frequent falls 04/12/2021   PCP:  [facility]:   [facility] [place], [place] - [address] [place] [address] Phone: [phone] Fax: [phone]     Social Determinants of Health (SDOH) Interventions    Readmission Risk Interventions No flowsheet data found.

## 2021-04-15 NOTE — Progress Notes (Signed)
PIV's removed. Discharge instructions completed. Patient verbalized understanding of medication regimen, follow up appointments and discharge instructions. Patient belongings gathered and packed to discharge. Given medicines sent from pharmacy for patient to take in the AM. Wife and son at bedside to take patient home.

## 2021-04-15 NOTE — Discharge Summary (Signed)
Physician Discharge Summary  Bynum Mccullars HBZ:[id number] DOB: 03-28-1934 DOA: 04/12/2021  PCP: [facility]  Admit date: 04/12/2021 Discharge date: 04/15/2021  Discharge disposition: Home   Recommendations for Outpatient Follow-Up:   Follow up with PCP in 1 week Follow up with cardiologist as scheduled   Discharge Diagnosis:   Principal Problem:   NSTEMI (non-ST elevated myocardial infarction) [facility]) Active Problems:   CAD (coronary artery disease)   Rhabdomyolysis   Essential hypertension   AMS (altered mental status)   Frequent falls   Vitamin B12 deficiency    Discharge Condition: Stable.  Diet recommendation:  Diet Order             Diet - low sodium heart healthy           Diet Heart Room service appropriate? Yes; Fluid consistency: Thin  Diet effective now                     Code Status: Full Code     Hospital Course:   [name] is a 87 y.o. male with medical history significant for CAD s/p CABG, hypertension, dementia with intermittent confusion, who was brought to the hospital because of frequent falls at home.  His troponins were elevated (maximum troponin was 3,752.)  He was admitted to the hospital for acute NSTEMI.  He was treated with IV heparin infusion.  He underwent left heart cath which showed severe three-vessel CAD but no coronary stents were placed.  Dual antiplatelet therapy with Plavix was recommended.   He was also found to have vitamin B12 deficiency (vitamin B12 level 156).  He was started on vitamin B12 injection.  He was evaluated by PT and OT because of frequent falls at home and home health therapy was recommended.  His condition has improved and he is deemed stable for discharge to home.  Discharge plan was discussed with the patient and his son, Steven Jacobson (over the phone).  Vitamin B12 injection was recommended at discharge.  However, patient said he and his wife would not be able to administer vitamin B12  injections.  This was confirmed by Steven Jacobson, his son.  Oral vitamin B12 was therefore prescribed.   Medical Consultants:   Cardiologist   Discharge Exam:    Vitals:   04/15/21 0030 04/15/21 0411 04/15/21 0742 04/15/21 1138  BP: 118/64 136/78 120/68 123/86  Pulse: 61 60 (!) 56 65  Resp: 14 14 20 20   Temp: 97.7 F (36.5 C) 98 F (36.7 C) 97.8 F (36.6 C) 98.3 F (36.8 C)  TempSrc: Oral Oral    SpO2: 96% 96% 96% 95%  Weight:      Height:         GEN: NAD SKIN: No rash EYES: EOMI ENT: MMM CV: RRR PULM: CTA B ABD: soft, ND, NT, +BS CNS: AAO x 3, non focal EXT: No edema or tenderness   The results of significant diagnostics from this hospitalization (including imaging, microbiology, ancillary and laboratory) are listed below for reference.     Procedures and Diagnostic Studies:   CT HEAD WO CONTRAST ( )  Result Date: 04/13/2021 CLINICAL DATA:  87 year old male with history of delirium. EXAM: CT HEAD WITHOUT CONTRAST TECHNIQUE: Contiguous axial images were obtained from the base of the skull through the vertex without intravenous contrast. COMPARISON:  Head CT 04/12/2021. FINDINGS: Brain: Mild cerebral atrophy. Patchy and confluent areas of decreased attenuation are noted throughout the deep and periventricular white matter of the cerebral  hemispheres bilaterally, compatible with chronic microvascular ischemic disease. No evidence of acute infarction, hemorrhage, hydrocephalus, extra-axial collection or mass lesion/mass effect. Vascular: No hyperdense vessel or unexpected calcification. Skull: Normal. Negative for fracture or focal lesion. Sinuses/Orbits: No acute finding. Large mucosal retention cyst or polyp in the right maxillary sinus. Mild multifocal mucosal thickening throughout the ethmoid sinuses bilaterally. Other: None. IMPRESSION: 1. No acute intracranial abnormalities. 2. Mild cerebral atrophy with chronic microvascular ischemic changes in the cerebral white matter,  as above. Electronically Signed   By: Trudie Reed M.D.   On: 04/13/2021 07:28   CARDIAC CATHETERIZATION  Result Date: 04/13/2021 Conclusions: Severe three vessel coronary artery disease, including chronic total occlusions of the ostial/proximal LAD and RCA, as well as 60% stenosis involving OM1 and severe diffuse distal LCx disease involving small terminal branches. Widely patent LIMA-LAD. Chronically occluded SVG-OM1. Severely diseased SVG-dRCA with 99% mid graft stenosis with TIMI-1 flow and occlusion of the distal graft (embolized debris versus competitive flow from well-collateralized distal RCA). Mildly-moderately elevated left ventricular filling pressure (LVEDP 20-30 mmHg). Recommendations: Medical therapy of NSTEMI, including up to 12 months of dual antiplatelet therapy with aspirin and clopidogrel, as tolerated.  PCI to SVG-dRCA deferred due to robust collateralization of dRCA from LCx, absence of angina, and poor distal graft outflow with high risk of microvascular embolization/occlusion. Gentle diuresis. Aggressive secondary prevention of coronary artery disease. Follow-up echo results. Findings and recommendations were discussed with Dr. Mariah Milling, as well as the patient and his wife. Yvonne Kendall, MD [facility]  ECHOCARDIOGRAM COMPLETE  Result Date: 04/13/2021    ECHOCARDIOGRAM REPORT   Patient Name:   Steven Jacobson Date of Exam: 04/13/2021 Medical Rec #:  [id number]  Height:       74.0 in Accession #:    [id number] Weight:       260.0 lb Date of Birth:  February 27, 1934  BSA:          2.430 m Patient Age:    87 years   BP:           146/86 mmHg Patient Gender: M          HR:           71 bpm. Exam Location:  [facility] Procedure: 2D Echo, Color Doppler, Cardiac Doppler and Intracardiac            Opacification Agent Indications:     I21.4 NSTEMI  History:         Patient has no prior history of Echocardiogram examinations.                  CAD.  Sonographer:     Humphrey Rolls Referring Phys:  [id number]  Antonieta Iba Diagnosing Phys: Julien Nordmann MD  Sonographer Comments: Suboptimal apical window and no subcostal window. Image acquisition challenging due to patient body habitus. IMPRESSIONS  1. Left ventricular ejection fraction, by estimation, is 50 to 55%. The left ventricle has low normal function. The left ventricle demonstrates regional wall motion abnormalities (septal wall hypokinesis in select images, possibly secondary to condution  abn and/or post operative state). Left ventricular diastolic parameters are consistent with Grade I diastolic dysfunction (impaired relaxation).  2. Right ventricular systolic function is normal. The right ventricular size is normal. Tricuspid regurgitation signal is inadequate for assessing PA pressure.  3. Left atrial size was mildly dilated.  4. The mitral valve is normal in structure. Mild mitral valve regurgitation. No evidence of mitral stenosis.  5. The aortic valve  is normal in structure. Aortic valve regurgitation is not visualized. Aortic valve sclerosis/calcification is present, without any evidence of aortic stenosis.  6. The inferior vena cava is normal in size with greater than 50% respiratory variability, suggesting right atrial pressure of 3 mmHg. FINDINGS  Left Ventricle: Left ventricular ejection fraction, by estimation, is 50 to 55%. The left ventricle has low normal function. The left ventricle demonstrates regional wall motion abnormalities. Definity contrast agent was given IV to delineate the left ventricular endocardial borders. The left ventricular internal cavity size was normal in size. There is no left ventricular hypertrophy. Left ventricular diastolic parameters are consistent with Grade I diastolic dysfunction (impaired relaxation). Right Ventricle: The right ventricular size is normal. No increase in right ventricular wall thickness. Right ventricular systolic function is normal. Tricuspid regurgitation signal is inadequate for assessing PA  pressure. Left Atrium: Left atrial size was mildly dilated. Right Atrium: Right atrial size was normal in size. Pericardium: There is no evidence of pericardial effusion. Mitral Valve: The mitral valve is normal in structure. Mild mitral valve regurgitation. No evidence of mitral valve stenosis. MV peak gradient, 3.9 mmHg. The mean mitral valve gradient is 1.0 mmHg. Tricuspid Valve: The tricuspid valve is normal in structure. Tricuspid valve regurgitation is mild . No evidence of tricuspid stenosis. Aortic Valve: The aortic valve is normal in structure. Aortic valve regurgitation is not visualized. Aortic valve sclerosis/calcification is present, without any evidence of aortic stenosis. Aortic valve mean gradient measures 4.0 mmHg. Aortic valve peak  gradient measures 7.8 mmHg. Aortic valve area, by VTI measures 4.53 cm. Pulmonic Valve: The pulmonic valve was normal in structure. Pulmonic valve regurgitation is mild. No evidence of pulmonic stenosis. Aorta: The aortic root is normal in size and structure. Venous: The inferior vena cava is normal in size with greater than 50% respiratory variability, suggesting right atrial pressure of 3 mmHg. IAS/Shunts: No atrial level shunt detected by color flow Doppler.  LEFT VENTRICLE PLAX 2D LVIDd:         5.60 cm   Diastology LVIDs:         4.50 cm   LV e' medial:    6.31 cm/s LV PW:         1.20 cm   LV E/e' medial:  9.6 LV IVS:        0.80 cm   LV e' lateral:   9.03 cm/s LVOT diam:     2.30 cm   LV E/e' lateral: 6.7 LV SV:         111 LV SV Index:   46 LVOT Area:     4.15 cm  LEFT ATRIUM             Index LA diam:        4.40 cm 1.81 cm/m LA Vol (A2C):   64.3 ml 26.46 ml/m LA Vol (A4C):   61.8 ml 25.43 ml/m LA Biplane Vol: 65.5 ml 26.96 ml/m  AORTIC VALVE                    PULMONIC VALVE AV Area (Vmax):    4.21 cm     PV Vmax:          1.25 m/s AV Area (Vmean):   4.00 cm     PV Vmean:         80.900 cm/s AV Area (VTI):     4.53 cm     PV VTI:  0.239 m AV  Vmax:           140.00 cm/s  PV Peak grad:     6.2 mmHg AV Vmean:          94.200 cm/s  PV Mean grad:     3.0 mmHg AV VTI:            0.246 m      PR End Diast Vel: 1.98 msec AV Peak Grad:      7.8 mmHg AV Mean Grad:      4.0 mmHg LVOT Vmax:         142.00 cm/s LVOT Vmean:        90.600 cm/s LVOT VTI:          0.268 m LVOT/AV VTI ratio: 1.09  AORTA Ao Root diam: 3.60 cm MITRAL VALVE MV Area (PHT): 4.12 cm    SHUNTS MV Area VTI:   5.10 cm    Systemic VTI:  0.27 m MV Peak grad:  3.9 mmHg    Systemic Diam: 2.30 cm MV Mean grad:  1.0 mmHg MV Vmax:       0.99 m/s MV Vmean:      56.2 cm/s MV Decel Time: 184 msec MV E velocity: 60.80 cm/s MV A velocity: 84.80 cm/s MV E/A ratio:  0.72 Julien Nordmann MD Electronically signed by Julien Nordmann MD Signature Date/Time: 04/13/2021/2:03:59 PM    Final      Labs:   Basic Metabolic Panel: Recent Labs  Lab 04/12/21 0815 04/12/21 1031 04/13/21 1041  NA 136  --  135  K 3.5  --  4.3  CL 104  --  104  CO2 24  --  26  GLUCOSE 111*  --  92  BUN 15  --  17  CREATININE 1.05  --  1.05  CALCIUM 8.8*  --  8.4*  MG  --  2.3  --    GFR Estimated Creatinine Clearance: 70.2 mL/min (by C-G formula based on SCr of 1.05 mg/dL). Liver Function Tests: Recent Labs  Lab 04/12/21 0815  AST 53*  ALT 27  ALKPHOS 69  BILITOT 1.1  PROT 7.0  ALBUMIN 3.8   No results for input(s): LIPASE, AMYLASE in the last 168 hours. No results for input(s): AMMONIA in the last 168 hours. Coagulation profile Recent Labs  Lab 04/12/21 1210  INR 1.1    CBC: Recent Labs  Lab 04/12/21 0815 04/12/21 1443 04/13/21 1041 04/14/21 0619 04/15/21 0612  WBC 19.5* 19.0* 16.2* 14.2* 11.2*  HGB 15.8 15.6 15.8 15.3 14.9  HCT 47.7 47.8 46.6 45.1 43.9  MCV 90.3 89.7 90.3 89.3 90.1  PLT 165 212 188 193 182   Cardiac Enzymes: Recent Labs  Lab 04/12/21 0815 04/13/21 1041  CKTOTAL 1,150* 832*   BNP: Invalid input(s): POCBNP CBG: No results for input(s): GLUCAP in the last 168  hours. D-Dimer No results for input(s): DDIMER in the last 72 hours. Hgb A1c Recent Labs    04/13/21 1041  HGBA1C 5.6   Lipid Profile Recent Labs    04/13/21 1041  CHOL 111  HDL 37*  LDLCALC 60  TRIG 71  CHOLHDL 3.0   Thyroid function studies Recent Labs    04/12/21 1443  TSH 1.288   Anemia work up Recent Labs    04/12/21 1443  VITAMINB12 156*  FOLATE 17.0   Microbiology Recent Results (from the past 240 hour(s))  Resp Panel by RT-PCR (Flu A&B, Covid) Urine, Clean Catch  Status: None   Collection Time: 04/12/21 10:31 AM   Specimen: Urine, Clean Catch; Nasopharyngeal(NP) swabs in vial transport medium  Result Value Ref Range Status   SARS Coronavirus 2 by RT PCR NEGATIVE NEGATIVE Final    Comment: (NOTE) SARS-CoV-2 target nucleic acids are NOT DETECTED.  The SARS-CoV-2 RNA is generally detectable in upper respiratory specimens during the acute phase of infection. The lowest concentration of SARS-CoV-2 viral copies this assay can detect is 138 copies/mL. A negative result does not preclude SARS-Cov-2 infection and should not be used as the sole basis for treatment or other patient management decisions. A negative result may occur with  improper specimen collection/handling, submission of specimen other than nasopharyngeal swab, presence of viral mutation(s) within the areas targeted by this assay, and inadequate number of viral copies(<138 copies/mL). A negative result must be combined with clinical observations, patient history, and epidemiological information. The expected result is Negative.  Fact Sheet for Patients:  BloggerCourse.com  Fact Sheet for Healthcare Providers:  SeriousBroker.it  This test is no t yet approved or cleared by the [place] FDA and  has been authorized for detection and/or diagnosis of SARS-CoV-2 by FDA under an Emergency Use Authorization (EUA). This EUA will remain  in  effect (meaning this test can be used) for the duration of the COVID-19 declaration under Section 564(b)(1) of the Act, 21 U.S.C.section 360bbb-3(b)(1), unless the authorization is terminated  or revoked sooner.       Influenza A by PCR NEGATIVE NEGATIVE Final   Influenza B by PCR NEGATIVE NEGATIVE Final    Comment: (NOTE) The Xpert Xpress SARS-CoV-2/FLU/RSV plus assay is intended as an aid in the diagnosis of influenza from Nasopharyngeal swab specimens and should not be used as a sole basis for treatment. Nasal washings and aspirates are unacceptable for Xpert Xpress SARS-CoV-2/FLU/RSV testing.  Fact Sheet for Patients: BloggerCourse.com  Fact Sheet for Healthcare Providers: SeriousBroker.it  This test is not yet approved or cleared by the [place] FDA and has been authorized for detection and/or diagnosis of SARS-CoV-2 by FDA under an Emergency Use Authorization (EUA). This EUA will remain in effect (meaning this test can be used) for the duration of the COVID-19 declaration under Section 564(b)(1) of the Act, 21 U.S.C. section 360bbb-3(b)(1), unless the authorization is terminated or revoked.  Performed at [facility], [address] [place], [place] [address]      Discharge Instructions:   Discharge Instructions     Diet - low sodium heart healthy   Complete by: As directed    Discharge instructions   Complete by: As directed    Follow-up with PCP for low vitamin B12 level   Increase activity slowly   Complete by: As directed    Increase activity slowly   Complete by: As directed    Schedule appointment   Complete by: As directed    Follow up with PCP in 1 week      Allergies as of 04/15/2021   No Known Allergies      Medication List     STOP taking these medications    nitroGLYCERIN 0.6 mg/hr patch Commonly known as: NITRODUR - Dosed in mg/24 hr       TAKE these medications     allopurinol 100 MG tablet Commonly known as: ZYLOPRIM Take 200 mg by mouth daily. To lower uric acid levels and prevent gout   aspirin 81 MG chewable tablet Chew 81 mg by mouth daily.   atorvastatin 80  MG tablet Commonly known as: LIPITOR Take 40 mg by mouth at bedtime.   clopidogrel 75 MG tablet Commonly known as: PLAVIX Take 1 tablet (75 mg total) by mouth daily with breakfast.   dorzolamide-timolol 22.3-6.8 MG/ML ophthalmic solution Commonly known as: COSOPT Place 1 drop into both eyes 2 (two) times daily.   hydrochlorothiazide 25 MG tablet Commonly known as: HYDRODIURIL Take 12.5 mg by mouth daily.   latanoprost 0.005 % ophthalmic solution Commonly known as: XALATAN Place 1 drop into both eyes at bedtime.   lisinopril 40 MG tablet Commonly known as: ZESTRIL Take 40 mg by mouth daily.   metoprolol tartrate 100 MG tablet Commonly known as: LOPRESSOR Take 100 mg by mouth every 12 (twelve) hours.   vitamin B-12 1000 MCG tablet Commonly known as: CYANOCOBALAMIN Take 1 tablet (1,000 mcg total) by mouth daily.           If you experience worsening of your admission symptoms, develop shortness of breath, life threatening emergency, suicidal or homicidal thoughts you must seek medical attention immediately by calling 911 or calling your MD immediately  if symptoms less severe.   You must read complete instructions/literature along with all the possible adverse reactions/side effects for all the medicines you take and that have been prescribed to you. Take any new medicines after you have completely understood and accept all the possible adverse reactions/side effects.    Please note   You were cared for by a hospitalist during your hospital stay. If you have any questions about your discharge medications or the care you received while you were in the hospital after you are discharged, you can call the unit and asked to speak with the hospitalist on call if the  hospitalist that took care of you is not available. Once you are discharged, your primary care physician will handle any further medical issues. Please note that NO REFILLS for any discharge medications will be authorized once you are discharged, as it is imperative that you return to your primary care physician (or establish a relationship with a primary care physician if you do not have one) for your aftercare needs so that they can reassess your need for medications and monitor your lab values.       Time coordinating discharge: 35 minutes  Signed:  Sharol Croghan  [facility] 04/15/2021, 1:58 PM   Pager on [email or web address]. If 7PM-7AM, please contact night-coverage at [email or web address]

## 2021-04-16 ENCOUNTER — Encounter: Payer: Self-pay | Admitting: Internal Medicine

## 2021-05-16 NOTE — Progress Notes (Signed)
° °  Patient was admitted for a medically managed NSTEMI. He was not felt to be a candidate for cardiac rehab following PT's recommendation for a rolling walker and home health PT. This will be re-evaluated in the outpatient setting.

## 2021-08-29 DIAGNOSIS — S12400A Unspecified displaced fracture of fifth cervical vertebra, initial encounter for closed fracture: Secondary | ICD-10-CM | POA: Insufficient documentation

## 2021-10-22 ENCOUNTER — Other Ambulatory Visit: Payer: Self-pay

## 2021-10-22 ENCOUNTER — Emergency Department
Admission: EM | Admit: 2021-10-22 | Discharge: 2021-10-22 | Disposition: A | Discharge status: Home / Self Care | Payer: No Typology Code available for payment source | Attending: Emergency Medicine | Admitting: Emergency Medicine

## 2021-10-22 ENCOUNTER — Encounter: Payer: Self-pay | Admitting: Emergency Medicine

## 2021-10-22 DIAGNOSIS — R339 Retention of urine, unspecified: Secondary | ICD-10-CM | POA: Diagnosis not present

## 2021-10-22 DIAGNOSIS — R319 Hematuria, unspecified: Secondary | ICD-10-CM | POA: Diagnosis present

## 2021-10-22 LAB — URINALYSIS, ROUTINE W REFLEX MICROSCOPIC
Bilirubin Urine: NEGATIVE
Glucose, UA: NEGATIVE mg/dL
Ketones, ur: NEGATIVE mg/dL
Nitrite: NEGATIVE
Protein, ur: 100 mg/dL — AB
Specific Gravity, Urine: 1 — ABNORMAL LOW (ref 1.005–1.030)
Squamous Epithelial / HPF: NONE SEEN (ref 0–5)
pH: 6 (ref 5.0–8.0)

## 2021-10-22 LAB — CBC
HCT: 47.9 % (ref 39.0–52.0)
Hemoglobin: 14.3 g/dL (ref 13.0–17.0)
MCH: 30 pg (ref 26.0–34.0)
MCHC: 29.9 g/dL — ABNORMAL LOW (ref 30.0–36.0)
MCV: 100.4 fL — ABNORMAL HIGH (ref 80.0–100.0)
Platelets: 254 10*3/uL (ref 150–400)
RBC: 4.77 MIL/uL (ref 4.22–5.81)
RDW: 14.8 % (ref 11.5–15.5)
WBC: 12.3 10*3/uL — ABNORMAL HIGH (ref 4.0–10.5)
nRBC: 0 % (ref 0.0–0.2)

## 2021-10-22 LAB — COMPREHENSIVE METABOLIC PANEL
ALT: 15 U/L (ref 0–44)
AST: 21 U/L (ref 15–41)
Albumin: 3.9 g/dL (ref 3.5–5.0)
Alkaline Phosphatase: 64 U/L (ref 38–126)
Anion gap: 8 (ref 5–15)
BUN: 27 mg/dL — ABNORMAL HIGH (ref 8–23)
CO2: 21 mmol/L — ABNORMAL LOW (ref 22–32)
Calcium: 9.2 mg/dL (ref 8.9–10.3)
Chloride: 112 mmol/L — ABNORMAL HIGH (ref 98–111)
Creatinine, Ser: 1.37 mg/dL — ABNORMAL HIGH (ref 0.61–1.24)
GFR, Estimated: 51 mL/min — ABNORMAL LOW (ref >60)
Glucose, Bld: 128 mg/dL — ABNORMAL HIGH (ref 70–99)
Potassium: 4 mmol/L (ref 3.5–5.1)
Sodium: 141 mmol/L (ref 135–145)
Total Bilirubin: 1 mg/dL (ref 0.3–1.2)
Total Protein: 7.5 g/dL (ref 6.5–8.1)

## 2021-10-22 MED ORDER — CEPHALEXIN 500 MG PO CAPS: 500.0000 mg | ORAL_CAPSULE | Freq: Two times a day (BID) | ORAL | 0 refills | Status: DC

## 2021-10-22 NOTE — ED Triage Notes (Signed)
Pt states hematuria for over a week. Pt denies pain. Pt without known fever. Pt states is not having trouble emptying bladder.

## 2021-10-22 NOTE — ED Notes (Signed)
Leg bag placed on pt and educated on how to empty and all follow up care.

## 2021-10-22 NOTE — ED Notes (Signed)
Pt was not able to produce a urine sample in triage.

## 2021-10-22 NOTE — ED Provider Notes (Signed)
[facility] Provider Note    Event Date/Time   First MD Initiated Contact with Patient 10/22/21 [phone]     (approximate)   History   Hematuria   HPI  Lucius Wise is a 88 y.o. male who presents with hematuria, the patient does not know if he is on blood thinners.  States that he had a catheter for several weeks, was removed and he has had hematuria since.  He is somewhat of a poor historian.  Medical records reviewed.  It appears that patient had urinary retention and had Foley catheter placed at [facility], had several failed voiding trials, on June 2 the patient refused to have catheter replaced during an office visit voiding trial.  Apparently since then he has been having hematuria but he denies suprapubic pain.  He is initially telling me that he refuses to have a catheter replaced     Physical Exam   Triage Vital Signs: ED Triage Vitals [10/22/21 0321]  Enc Vitals Group     BP 128/76     Pulse Rate 98     Resp 14     Temp 98.4 F (36.9 C)     Temp Source Oral     SpO2 97 %     Weight 104.3 kg (230 lb)     Height 1.88 m (6\' 2" )     Head Circumference      Peak Flow      Pain Score 0     Pain Loc      Pain Edu?      Excl. in GC?     Most recent vital signs: Vitals:   10/22/21 0321 10/22/21 0717  BP: 128/76 105/74  Pulse: 98 95  Resp: 14 17  Temp: 98.4 F (36.9 C)   SpO2: 97% 98%     General: Awake, no distress.  CV:  Good peripheral perfusion.  Resp:  Normal effort.  Abd:  No distention.  Other:  GU: When foreskin retracted bleeding noted from urethra, no evidence of trauma   ED Results / Procedures / Treatments   Labs (all labs ordered are listed, but only abnormal results are displayed) Labs Reviewed  URINALYSIS, ROUTINE W REFLEX MICROSCOPIC - Abnormal; Notable for the following components:      Result Value   Color, Urine RED (*)    APPearance HAZY (*)    Specific Gravity, Urine 1.000 (*)    Hgb urine dipstick  MODERATE (*)    Protein, ur 100 (*)    Leukocytes,Ua LARGE (*)    Bacteria, UA RARE (*)    All other components within normal limits  CBC - Abnormal; Notable for the following components:   WBC 12.3 (*)    MCV 100.4 (*)    MCHC 29.9 (*)    All other components within normal limits  COMPREHENSIVE METABOLIC PANEL - Abnormal; Notable for the following components:   Chloride 112 (*)    CO2 21 (*)    Glucose, Bld 128 (*)    BUN 27 (*)    Creatinine, Ser 1.37 (*)    GFR, Estimated 51 (*)    All other components within normal limits     EKG     RADIOLOGY     PROCEDURES:  Critical Care performed:   Procedures   MEDICATIONS ORDERED IN ED: Medications - No data to display   IMPRESSION / MDM / ASSESSMENT AND PLAN / ED COURSE  I reviewed the triage vital  signs and the nursing notes. Patient's presentation is most consistent with exacerbation of chronic illness.  Patient presents with bleeding as above.  Has been seeing urology at [facility], had refused to have catheter replaced.  Lab work here reviewed and is unremarkable, hemoglobin is normal.  Patient is mildly dehydrated but otherwise reassuring labs.  Patient has agreed to have Foley catheter placed again, we will place a large catheter in case we need to do irrigation  Patient feels better after IV catheter placed, reddish tinged urine nearly 600 cc.  Appropriate for discharge with follow-up with his urologist      FINAL CLINICAL IMPRESSION(S) / ED DIAGNOSES   Final diagnoses:  Urinary retention  Hematuria, unspecified type     Rx / DC Orders   ED Discharge Orders          Ordered    cephALEXin (KEFLEX) 500 MG capsule  2 times daily        10/22/21 0914             Note:  This document was prepared using Dragon voice recognition software and may include unintentional dictation errors.   Jene Every, MD 10/22/21 [phone]

## 2021-11-08 ENCOUNTER — Encounter: Payer: Self-pay | Admitting: Emergency Medicine

## 2021-11-08 ENCOUNTER — Other Ambulatory Visit: Payer: Self-pay

## 2021-11-08 ENCOUNTER — Emergency Department
Admission: EM | Admit: 2021-11-08 | Discharge: 2021-11-08 | Disposition: A | Discharge status: Home / Self Care | Payer: Medicare Other | Attending: Emergency Medicine | Admitting: Emergency Medicine

## 2021-11-08 DIAGNOSIS — I2510 Atherosclerotic heart disease of native coronary artery without angina pectoris: Secondary | ICD-10-CM | POA: Insufficient documentation

## 2021-11-08 DIAGNOSIS — I10 Essential (primary) hypertension: Secondary | ICD-10-CM | POA: Insufficient documentation

## 2021-11-08 DIAGNOSIS — T83098A Other mechanical complication of other indwelling urethral catheter, initial encounter: Secondary | ICD-10-CM | POA: Insufficient documentation

## 2021-11-08 DIAGNOSIS — Z7689 Persons encountering health services in other specified circumstances: Secondary | ICD-10-CM

## 2021-11-08 DIAGNOSIS — Y732 Prosthetic and other implants, materials and accessory gastroenterology and urology devices associated with adverse incidents: Secondary | ICD-10-CM | POA: Insufficient documentation

## 2021-11-08 DIAGNOSIS — Z466 Encounter for fitting and adjustment of urinary device: Secondary | ICD-10-CM | POA: Insufficient documentation

## 2021-11-08 MED ORDER — LIDOCAINE HCL 2 % EX GEL: 1.0000 | CUTANEOUS | 0 refills | Status: DC | PRN

## 2021-11-08 NOTE — ED Provider Notes (Cosign Needed)
[facility] Provider Note    Event Date/Time   First MD Initiated Contact with Patient 11/08/21 1054     (approximate)   History   catheter issues   HPI  Steven Jacobson is a 88 y.o. male who presents today with request for Foley removal.  Per chart review, patient was most recently seen at [facility] on 10/19/2021 and had his Foley removed and had a voiding trial which he failed.  He refused to have the Foley reinserted.  He returned the emergency department on 10/22/2021 at which point he had Foley catheter reinserted.  Patient reports that he is unsure how to follow-up with to get the Foley catheter removed.  He did not talk to a urologist today as the triage note suggests.  Patient reports that he would like to follow-up with the urology department here.  He denies any problems with his Foley catheter.  He denies any pain or fevers.  He reports that his Foley catheter has been draining normally.  Patient Active Problem List   Diagnosis Date Noted   Vitamin B12 deficiency 04/14/2021   NSTEMI (non-ST elevated myocardial infarction) (HCC) 04/12/2021   CAD (coronary artery disease) 04/12/2021   Rhabdomyolysis 04/12/2021   Essential hypertension 04/12/2021   AMS (altered mental status) 04/12/2021   Frequent falls 04/12/2021          Physical Exam   Triage Vital Signs: ED Triage Vitals [11/08/21 1041]  Enc Vitals Group     BP 118/79     Pulse Rate 84     Resp 18     Temp 98.9 F (37.2 C)     Temp Source Oral     SpO2 97 %     Weight      Height      Head Circumference      Peak Flow      Pain Score 0     Pain Loc      Pain Edu?      Excl. in GC?     Most recent vital signs: Vitals:   11/08/21 1041  BP: 118/79  Pulse: 84  Resp: 18  Temp: 98.9 F (37.2 C)  SpO2: 97%    Physical Exam Vitals and nursing note reviewed.  Constitutional:      General: Awake and alert. No acute distress.    Appearance: Normal appearance. The patient is normal  weight.  HENT:     Head: Normocephalic and atraumatic.     Mouth: Mucous membranes are moist.  Eyes:     General: PERRL. Normal EOMs        Right eye: No discharge.        Left eye: No discharge.     Conjunctiva/sclera: Conjunctivae normal.  Cardiovascular:     Rate and Rhythm: Normal rate and regular rhythm.     Pulses: Normal pulses.     Heart sounds: Normal heart sounds Pulmonary:     Effort: Pulmonary effort is normal. No respiratory distress.     Breath sounds: Normal breath sounds.  Abdominal:     Abdomen is soft. There is no abdominal tenderness. No rebound or guarding. No distention. GU: Foley catheter in place.  There is mild oozing around the Foley catheter urethral entry site without skin breakdown or wounds noted.  No leaking of urine.  Normal penis and testicles Musculoskeletal:        General: No swelling. Normal range of motion.     Cervical back:  Normal range of motion and neck supple.  Skin:    General: Skin is warm and dry.     Capillary Refill: Capillary refill takes less than 2 seconds.     Findings: No rash.  Neurological:     Mental Status: The patient is awake and alert.      ED Results / Procedures / Treatments   Labs (all labs ordered are listed, but only abnormal results are displayed) Labs Reviewed - No data to display   EKG     RADIOLOGY     PROCEDURES:  Critical Care performed:   Procedures   MEDICATIONS ORDERED IN ED: Medications - No data to display   IMPRESSION / MDM / ASSESSMENT AND PLAN / ED COURSE  I reviewed the triage vital signs and the nursing notes.   Differential diagnosis includes, but is not limited to, urinary obstruction, BPH, Foley catheter evaluation.  Patient has mild oozing around the catheter site which was cleaned.  He was given lidocaine jelly to help with symptomatic relief.  It appears to be draining appropriately.  There are no infectious type symptoms.  Patient was unsure when his Foley should be  removed.  He wishes to follow-up with our urology department.  He initially requested for [place] to remove it in the emergency department, and I advised that we can trial Foley catheter removal and voiding trial in the ER, with the understanding that if he is unable to urinate by himself the Foley will have to be reinserted.  Patient opted to keep the Foley in place because he does not wish for it to be reinserted.  He was given the appropriate follow-up information for our urology department, and the nursing staff was able to call urology department to help him make the appointment.  We discussed return precautions and the importance of close outpatient follow-up.  Clinically does not appear to be any infection as he has no suprapubic or flank pain, no foul-smelling urine, and no systemic or constitutional symptoms.  We did discuss return precautions at length and he and wife understand and agree.  He was discharged in stable condition.   Patient's presentation is most consistent with severe exacerbation of chronic illness.      FINAL CLINICAL IMPRESSION(S) / ED DIAGNOSES   Final diagnoses:  Encounter for evaluation of Foley catheter     Rx / DC Orders   ED Discharge Orders          Ordered    lidocaine (XYLOCAINE) 2 % jelly  As needed        11/08/21 1129             Note:  This document was prepared using Dragon voice recognition software and may include unintentional dictation errors.   Jackelyn Hoehn, PA-C 11/08/21 1450

## 2021-11-08 NOTE — ED Notes (Signed)
57 yom with a c/c of wanting his foley catheter removed.

## 2021-11-08 NOTE — ED Triage Notes (Signed)
Pt reports he is here to have his catheter removed. Says he saw urology and got sent here to have it removed. Unable to find notes supporting that.

## 2021-11-08 NOTE — Discharge Instructions (Signed)
Please follow-up with urology this week for Foley catheter evaluation and possible removal.  Please return to the emergency department immediately for any new, worsening, or change in symptoms or other concerns.

## 2021-11-08 NOTE — ED Notes (Addendum)
The pt's foley leg bag was replaced with at the exterior tubing site. Bleeding was present at the end his penis as well as in the new bag. PA Poggi was aware of same. Lidocaine was placed on q-tip and place on the opening of his penis to relieve his burning. The pt was assisted with cleaning his leg and the bag was secured. The pt was assisted to his feet and ambulated with assistance to the wheel chair. The pt was advised to contact urology as soon as possible to get an appointment. The pt advised he did not have a cellular phone. The pt was wheeled to the black phone in the lobby area. The pt requested I dial the urology number. The pt advised the staff member he was bleeding and they told him to call them. The pt then advised me to take the phone. I advised the staff member at urology that the pt had been evaluated in the ED and was told to follow up with urology for an existing foley cath. I explained to the staff member we had instructed the pt to follow up with them asap, even try to call after leaving the ED to get an appointment or be seen today. The staff member instructed me to tell the pt they would follow up with him via his home phone. The pt was wheeled just outside the ED under the awning and was able to stand and ambulate without assistance to his vehicle in the parking lot.

## 2023-01-28 IMAGING — CT CT HEAD W/O CM
4 series · 17 of 47 positions shown, 19 images · non-contrast
Comparison: Head CT 04/12/2021.

CLINICAL DATA: 87-year-old male with history of delirium.

EXAM:
CT HEAD WITHOUT CONTRAST
TECHNIQUE: Contiguous axial images were obtained from the base of the skull
through the vertex without intravenous contrast.

[Series 2: head wo · axial · 0.47mm/px · z∈[-1,+129]mm · 7 of 36 slices shown, 9 images]
[im 5/36  brain]
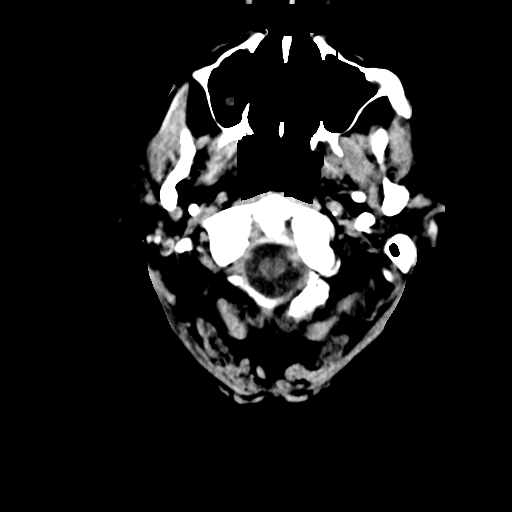
[im 5/36  bone]
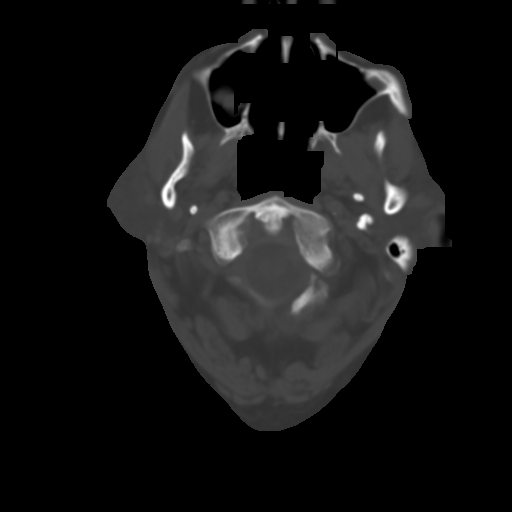
[im 9/36  brain]
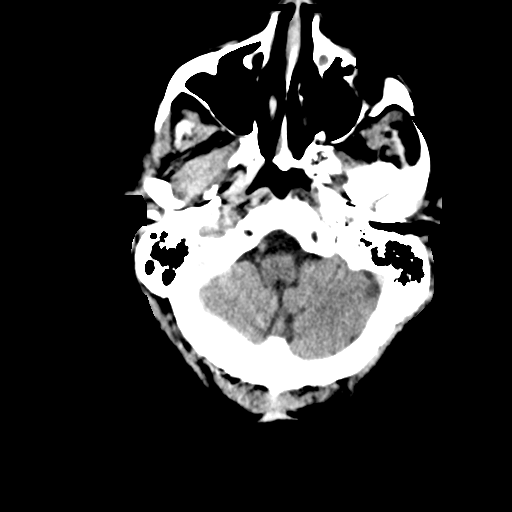
[im 14/36  brain]
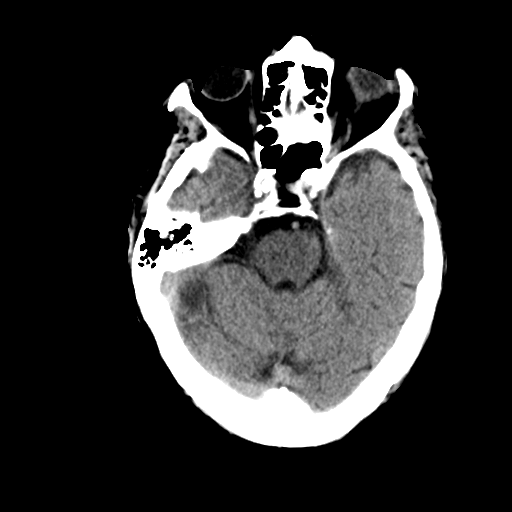
[im 18/36  brain]
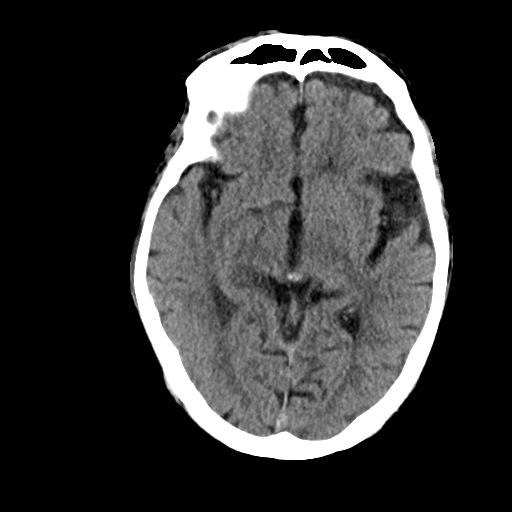
[im 22/36  brain]
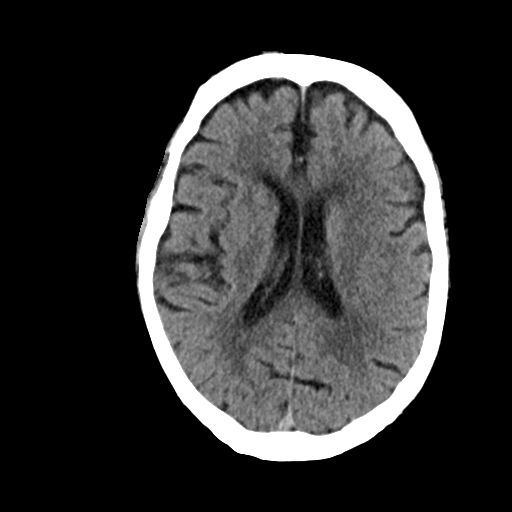
[im 22/36  bone]
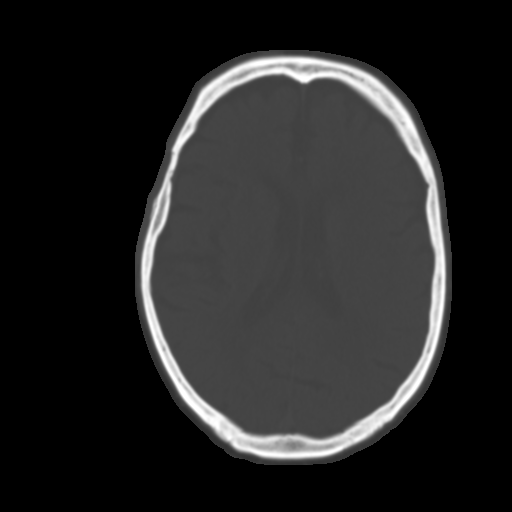
[im 27/36  brain]
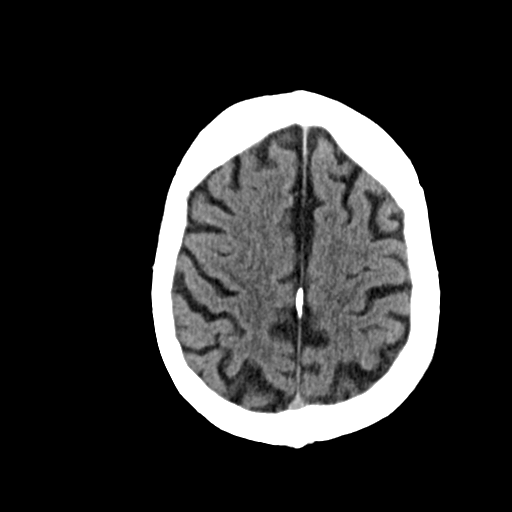
[im 31/36  brain]
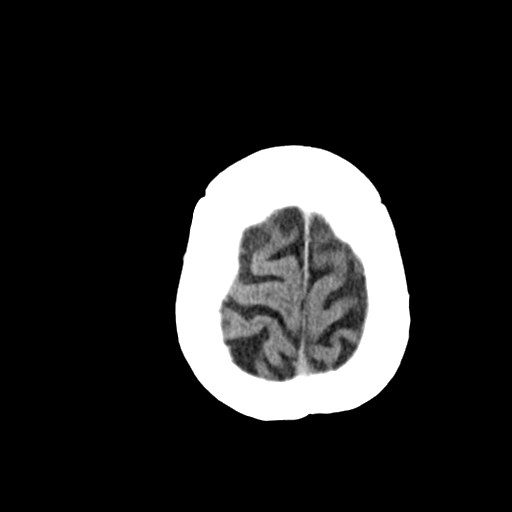

[Series 3: head bone · axial · 0.47mm/px · z∈[-5,+59]mm · 4 of 90 slices shown]
[im 9/90  bone]
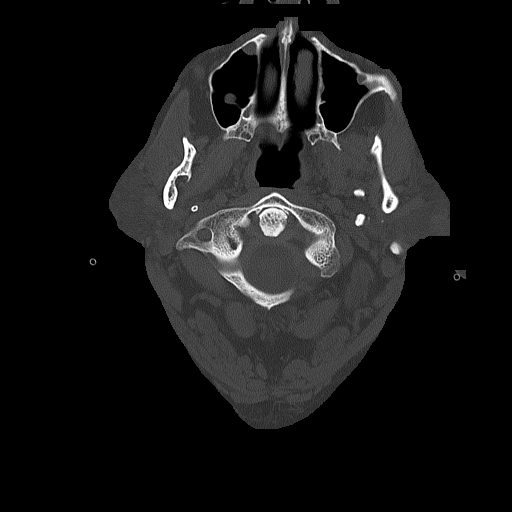
[im 18/90  bone]
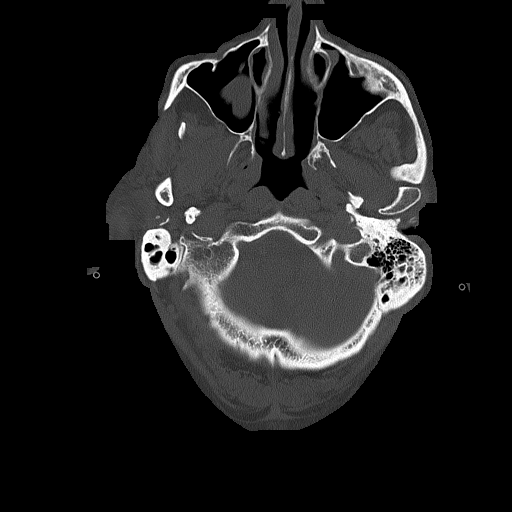
[im 27/90  bone]
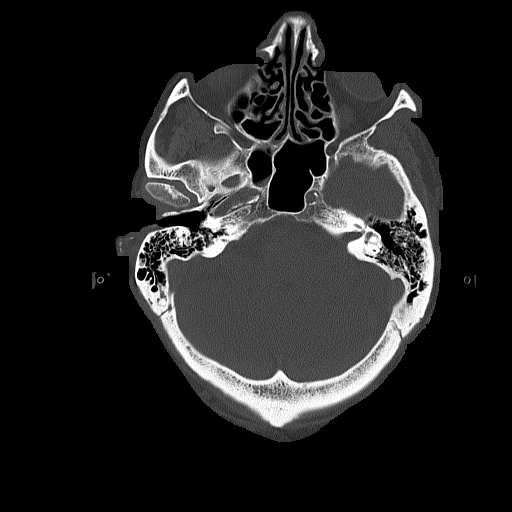
[im 41/90  bone]
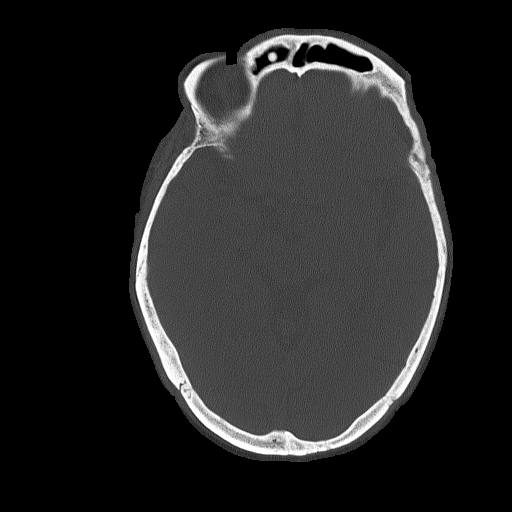

[Series 4: coronal soft tissue · coronal · 0.35mm/px · 3 of 75 slices shown]
[im 25/75  brain]
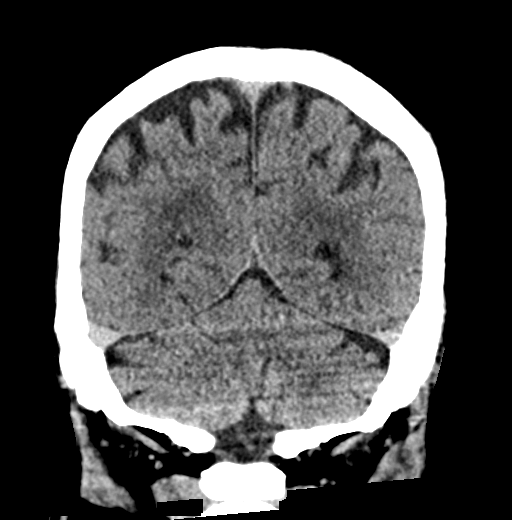
[im 33/75  brain]
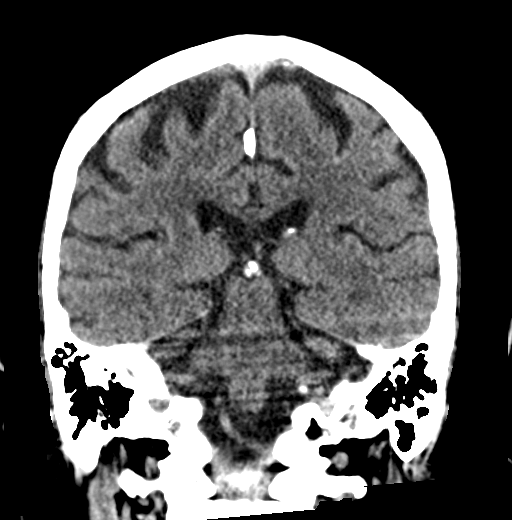
[im 42/75  brain]
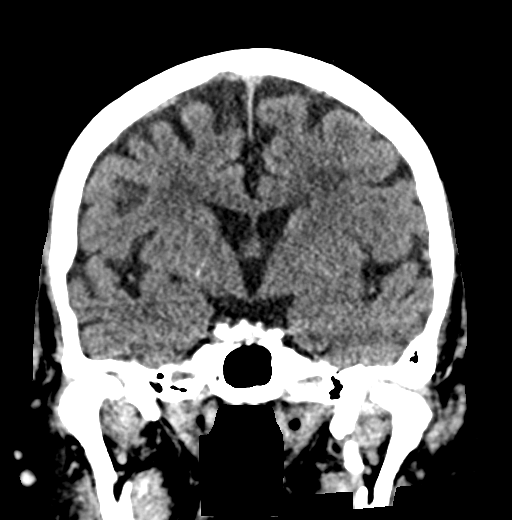

[Series 5: sagittal soft tissue · sagittal · 0.36mm/px · 3 of 61 slices shown]
[im 21/61  brain]
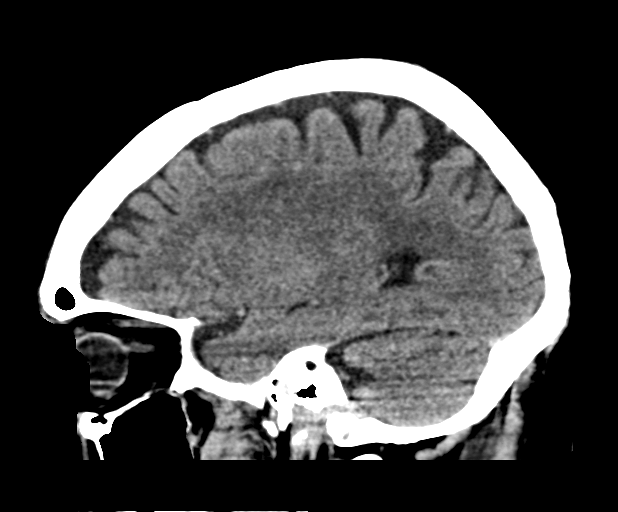
[im 31/61  brain]
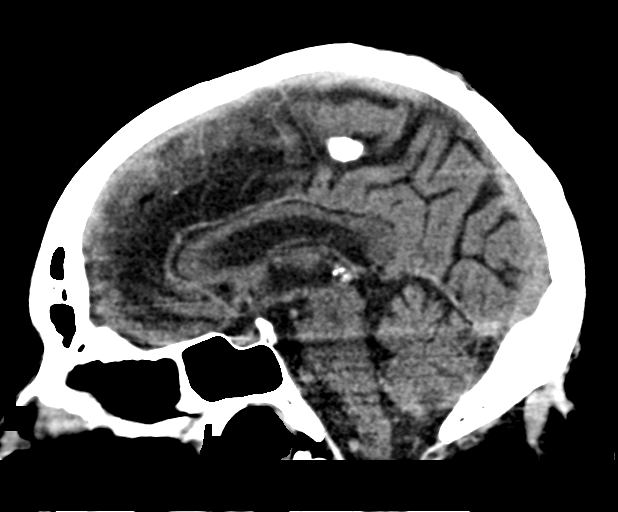
[im 41/61  brain]
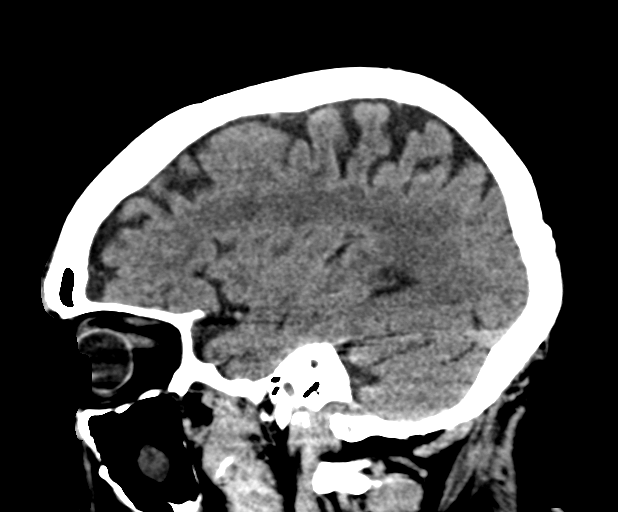

[17 of 47 positions shown; findings below may reference images not displayed]

FINDINGS: Brain: Mild cerebral atrophy. Patchy and confluent areas of
decreased attenuation are noted throughout the deep and
periventricular white matter of the cerebral hemispheres
bilaterally, compatible with chronic microvascular ischemic disease.
No evidence of acute infarction, hemorrhage, hydrocephalus,
extra-axial collection or mass lesion/mass effect.

Vascular: No hyperdense vessel or unexpected calcification.

Skull: Normal. Negative for fracture or focal lesion.

Sinuses/Orbits: No acute finding. Large mucosal retention cyst or
polyp in the right maxillary sinus. Mild multifocal mucosal
thickening throughout the ethmoid sinuses bilaterally.

Other: None.
IMPRESSION: 1. No acute intracranial abnormalities.
2. Mild cerebral atrophy with chronic microvascular ischemic changes
in the cerebral white matter, as above.

## 2023-06-22 ENCOUNTER — Emergency Department: Payer: Medicare Other

## 2023-06-22 ENCOUNTER — Other Ambulatory Visit: Payer: Self-pay

## 2023-06-22 ENCOUNTER — Inpatient Hospital Stay
Admission: EM | Admit: 2023-06-22 | Discharge: 2023-06-24 | DRG: 689 | Disposition: A | Discharge status: Home / Self Care | Payer: Medicare Other | Attending: Internal Medicine | Admitting: Internal Medicine

## 2023-06-22 DIAGNOSIS — N3000 Acute cystitis without hematuria: Secondary | ICD-10-CM | POA: Diagnosis not present

## 2023-06-22 DIAGNOSIS — H401190 Primary open-angle glaucoma, unspecified eye, stage unspecified: Secondary | ICD-10-CM | POA: Insufficient documentation

## 2023-06-22 DIAGNOSIS — N3001 Acute cystitis with hematuria: Secondary | ICD-10-CM | POA: Diagnosis not present

## 2023-06-22 DIAGNOSIS — Z7982 Long term (current) use of aspirin: Secondary | ICD-10-CM

## 2023-06-22 DIAGNOSIS — I10 Essential (primary) hypertension: Secondary | ICD-10-CM | POA: Diagnosis present

## 2023-06-22 DIAGNOSIS — R410 Disorientation, unspecified: Secondary | ICD-10-CM

## 2023-06-22 DIAGNOSIS — I255 Ischemic cardiomyopathy: Secondary | ICD-10-CM

## 2023-06-22 DIAGNOSIS — B9620 Unspecified Escherichia coli [E. coli] as the cause of diseases classified elsewhere: Secondary | ICD-10-CM | POA: Diagnosis present

## 2023-06-22 DIAGNOSIS — R4182 Altered mental status, unspecified: Principal | ICD-10-CM | POA: Diagnosis present

## 2023-06-22 DIAGNOSIS — I110 Hypertensive heart disease with heart failure: Secondary | ICD-10-CM | POA: Diagnosis present

## 2023-06-22 DIAGNOSIS — I429 Cardiomyopathy, unspecified: Secondary | ICD-10-CM

## 2023-06-22 DIAGNOSIS — Z951 Presence of aortocoronary bypass graft: Secondary | ICD-10-CM

## 2023-06-22 DIAGNOSIS — I5022 Chronic systolic (congestive) heart failure: Secondary | ICD-10-CM | POA: Diagnosis present

## 2023-06-22 DIAGNOSIS — N390 Urinary tract infection, site not specified: Secondary | ICD-10-CM | POA: Diagnosis present

## 2023-06-22 DIAGNOSIS — R339 Retention of urine, unspecified: Secondary | ICD-10-CM | POA: Insufficient documentation

## 2023-06-22 DIAGNOSIS — I252 Old myocardial infarction: Secondary | ICD-10-CM

## 2023-06-22 DIAGNOSIS — N400 Enlarged prostate without lower urinary tract symptoms: Secondary | ICD-10-CM | POA: Diagnosis present

## 2023-06-22 DIAGNOSIS — Z888 Allergy status to other drugs, medicaments and biological substances status: Secondary | ICD-10-CM

## 2023-06-22 DIAGNOSIS — Z79899 Other long term (current) drug therapy: Secondary | ICD-10-CM

## 2023-06-22 DIAGNOSIS — I2510 Atherosclerotic heart disease of native coronary artery without angina pectoris: Secondary | ICD-10-CM | POA: Diagnosis not present

## 2023-06-22 DIAGNOSIS — G9341 Metabolic encephalopathy: Secondary | ICD-10-CM | POA: Diagnosis present

## 2023-06-22 DIAGNOSIS — Z1611 Resistance to penicillins: Secondary | ICD-10-CM | POA: Diagnosis present

## 2023-06-22 LAB — CBC WITH DIFFERENTIAL/PLATELET
Abs Immature Granulocytes: 0.04 10*3/uL (ref 0.00–0.07)
Basophils Absolute: 0 10*3/uL (ref 0.0–0.1)
Basophils Relative: 0 %
Eosinophils Absolute: 0.2 10*3/uL (ref 0.0–0.5)
Eosinophils Relative: 2 %
HCT: 43.8 % (ref 39.0–52.0)
Hemoglobin: 14.3 g/dL (ref 13.0–17.0)
Immature Granulocytes: 0 %
Lymphocytes Relative: 24 %
Lymphs Abs: 2.5 10*3/uL (ref 0.7–4.0)
MCH: 30.2 pg (ref 26.0–34.0)
MCHC: 32.6 g/dL (ref 30.0–36.0)
MCV: 92.4 fL (ref 80.0–100.0)
Monocytes Absolute: 1 10*3/uL (ref 0.1–1.0)
Monocytes Relative: 10 %
Neutro Abs: 6.7 10*3/uL (ref 1.7–7.7)
Neutrophils Relative %: 64 %
Platelets: 154 10*3/uL (ref 150–400)
RBC: 4.74 MIL/uL (ref 4.22–5.81)
RDW: 14.9 % (ref 11.5–15.5)
WBC: 10.5 10*3/uL (ref 4.0–10.5)
nRBC: 0 % (ref 0.0–0.2)

## 2023-06-22 LAB — COMPREHENSIVE METABOLIC PANEL
ALT: 21 U/L (ref 0–44)
AST: 23 U/L (ref 15–41)
Albumin: 3.7 g/dL (ref 3.5–5.0)
Alkaline Phosphatase: 63 U/L (ref 38–126)
Anion gap: 10 (ref 5–15)
BUN: 19 mg/dL (ref 8–23)
CO2: 23 mmol/L (ref 22–32)
Calcium: 8.8 mg/dL — ABNORMAL LOW (ref 8.9–10.3)
Chloride: 103 mmol/L (ref 98–111)
Creatinine, Ser: 1.15 mg/dL (ref 0.61–1.24)
GFR, Estimated: >60 mL/min (ref >60)
Glucose, Bld: 109 mg/dL — ABNORMAL HIGH (ref 70–99)
Potassium: 4 mmol/L (ref 3.5–5.1)
Sodium: 136 mmol/L (ref 135–145)
Total Bilirubin: 1.3 mg/dL — ABNORMAL HIGH (ref 0.0–1.2)
Total Protein: 6.6 g/dL (ref 6.5–8.1)

## 2023-06-22 LAB — URINALYSIS, ROUTINE W REFLEX MICROSCOPIC
Bilirubin Urine: NEGATIVE
Glucose, UA: NEGATIVE mg/dL
Hgb urine dipstick: NEGATIVE
Ketones, ur: NEGATIVE mg/dL
Nitrite: POSITIVE — AB
Protein, ur: 30 mg/dL — AB
Specific Gravity, Urine: 1.016 (ref 1.005–1.030)
WBC, UA: >50 WBC/hpf (ref 0–5)
pH: 6 (ref 5.0–8.0)

## 2023-06-22 LAB — TROPONIN I (HIGH SENSITIVITY)
Troponin I (High Sensitivity): 12 ng/L (ref <18)
Troponin I (High Sensitivity): 27 ng/L — ABNORMAL HIGH (ref <18)

## 2023-06-22 MED ORDER — LATANOPROST 0.005 % OP SOLN
1.0000 [drp] | Freq: Every day | OPHTHALMIC | Status: DC
  Filled 2023-06-22: qty 2.5

## 2023-06-22 MED ORDER — POLYETHYLENE GLYCOL 3350 17 G PO PACK: 17.0000 g | PACK | Freq: Every day | ORAL | Status: DC | PRN

## 2023-06-22 MED ORDER — ENOXAPARIN SODIUM 60 MG/0.6ML IJ SOSY
60.0000 mg | PREFILLED_SYRINGE | INTRAMUSCULAR | Status: DC
  Administered 2023-06-22 – 2023-06-23 (×2): 60 mg via SUBCUTANEOUS
  Filled 2023-06-22 (×2): qty 0.6

## 2023-06-22 MED ORDER — METOPROLOL TARTRATE 50 MG PO TABS
100.0000 mg | ORAL_TABLET | Freq: Two times a day (BID) | ORAL | Status: DC
  Administered 2023-06-22 – 2023-06-24 (×3): 100 mg via ORAL
  Filled 2023-06-22 (×4): qty 2

## 2023-06-22 MED ORDER — SODIUM CHLORIDE 0.9 % IV SOLN
1.0000 g | Freq: Once | INTRAVENOUS | Status: AC
  Administered 2023-06-22: 1 g via INTRAVENOUS
  Filled 2023-06-22: qty 10

## 2023-06-22 MED ORDER — ACETAMINOPHEN 650 MG RE SUPP: 650.0000 mg | Freq: Four times a day (QID) | RECTAL | Status: DC | PRN

## 2023-06-22 MED ORDER — ASPIRIN 81 MG PO CHEW
81.0000 mg | CHEWABLE_TABLET | Freq: Every day | ORAL | Status: DC
  Administered 2023-06-23 – 2023-06-24 (×2): 81 mg via ORAL
  Filled 2023-06-22 (×2): qty 1

## 2023-06-22 MED ORDER — VITAMIN B-12 1000 MCG PO TABS
1000.0000 ug | ORAL_TABLET | Freq: Every day | ORAL | Status: DC
  Administered 2023-06-23 – 2023-06-24 (×2): 1000 ug via ORAL
  Filled 2023-06-22: qty 1
  Filled 2023-06-22: qty 2

## 2023-06-22 MED ORDER — ONDANSETRON HCL 4 MG PO TABS: 4.0000 mg | ORAL_TABLET | Freq: Four times a day (QID) | ORAL | Status: DC | PRN

## 2023-06-22 MED ORDER — SODIUM CHLORIDE 0.9 % IV SOLN
1.0000 g | INTRAVENOUS | Status: DC
  Administered 2023-06-23: 1 g via INTRAVENOUS
  Filled 2023-06-22 (×2): qty 10

## 2023-06-22 MED ORDER — ACETAMINOPHEN 325 MG PO TABS: 650.0000 mg | ORAL_TABLET | Freq: Four times a day (QID) | ORAL | Status: DC | PRN

## 2023-06-22 MED ORDER — ATORVASTATIN CALCIUM 20 MG PO TABS
40.0000 mg | ORAL_TABLET | Freq: Every day | ORAL | Status: DC
  Administered 2023-06-22 – 2023-06-23 (×2): 40 mg via ORAL
  Filled 2023-06-22 (×2): qty 2

## 2023-06-22 MED ORDER — CLOPIDOGREL BISULFATE 75 MG PO TABS
75.0000 mg | ORAL_TABLET | Freq: Every day | ORAL | Status: DC
  Administered 2023-06-23 – 2023-06-24 (×2): 75 mg via ORAL
  Filled 2023-06-22 (×2): qty 1

## 2023-06-22 MED ORDER — SODIUM CHLORIDE 0.9% FLUSH
3.0000 mL | Freq: Two times a day (BID) | INTRAVENOUS | Status: DC
  Administered 2023-06-22 – 2023-06-24 (×4): 3 mL via INTRAVENOUS

## 2023-06-22 MED ORDER — ONDANSETRON HCL 4 MG/2ML IJ SOLN: 4.0000 mg | Freq: Four times a day (QID) | INTRAMUSCULAR | Status: DC | PRN

## 2023-06-22 MED ORDER — ALLOPURINOL 100 MG PO TABS
200.0000 mg | ORAL_TABLET | Freq: Every day | ORAL | Status: DC
  Administered 2023-06-23 – 2023-06-24 (×2): 200 mg via ORAL
  Filled 2023-06-22 (×4): qty 2

## 2023-06-22 NOTE — ED Notes (Signed)
Pt changed into hospital gown. Clothing given to son to take home.

## 2023-06-22 NOTE — ED Notes (Signed)
 Pt taken to CT.

## 2023-06-22 NOTE — Assessment & Plan Note (Addendum)
Patient presenting with 1 day of altered mental status, with no improvement since arriving to the ED, suspected to be delirium in the setting of underlying infection CT head negative.  No focal neurological findings on examination other than disorientation.  - Management of UTI as noted below - Monitor for urinary retention - Avoid centrally acting medications - Delirium precautions - PT/OT

## 2023-06-22 NOTE — ED Provider Notes (Signed)
[facility] Provider Note    Event Date/Time   First MD Initiated Contact with Patient 06/22/23 1018     (approximate)   History   Chief Complaint Altered Mental Status   HPI  Steven Jacobson is a 89 y.o. male with past medical history of hypertension, CAD status post CABG, and SVT who presents to the ED for altered mental status.  Per EMS, patient was found by family member to be minimally responsive in his recliner this morning.  Family member had not seen patient since he went to bed last night, when he was reportedly well.  Patient states he does not remember getting up out of bed and going to the recliner this morning, does state that he felt well when he went to bed last night.  He currently states "I am fine" and denies any complaints.  EMS reports that patient is alert and oriented x 4 at baseline.  On EMS arrival, fire department had moved patient to the floor, but he reportedly became increasingly alert during transport.  Patient found to be incontinent of urine by EMS.     Physical Exam   Triage Vital Signs: ED Triage Vitals  Encounter Vitals Group     BP      Systolic BP Percentile      Diastolic BP Percentile      Pulse      Resp      Temp      Temp src      SpO2      Weight      Height      Head Circumference      Peak Flow      Pain Score      Pain Loc      Pain Education      Exclude from Growth Chart     Most recent vital signs: Vitals:   06/22/23 1025 06/22/23 1028  BP: (!) 161/85   Pulse: 91   Resp: (!) 22   Temp:  98.5 F (36.9 C)  SpO2: 94%     Constitutional: Alert and oriented to person, but not place, time, or situation. Eyes: Conjunctivae are normal. Head: Atraumatic. Nose: No congestion/rhinnorhea. Mouth/Throat: Mucous membranes are moist.  Neck: No midline cervical spine tenderness to palpation. Cardiovascular: Normal rate, regular rhythm. Grossly normal heart sounds.  2+ radial pulses  bilaterally. Respiratory: Normal respiratory effort.  No retractions. Lungs CTAB. Gastrointestinal: Soft and nontender. No distention. Musculoskeletal: No lower extremity tenderness nor edema.  Neurologic:  Normal speech and language. No gross focal neurologic deficits are appreciated.    ED Results / Procedures / Treatments   Labs (all labs ordered are listed, but only abnormal results are displayed) Labs Reviewed  COMPREHENSIVE METABOLIC PANEL - Abnormal; Notable for the following components:      Result Value   Glucose, Bld 109 (*)    Calcium 8.8 (*)    Total Bilirubin 1.3 (*)    All other components within normal limits  URINALYSIS, ROUTINE W REFLEX MICROSCOPIC - Abnormal; Notable for the following components:   Color, Urine YELLOW (*)    APPearance CLOUDY (*)    Protein, ur 30 (*)    Nitrite POSITIVE (*)    Leukocytes,Ua MODERATE (*)    Bacteria, UA FEW (*)    All other components within normal limits  URINE CULTURE  CBC WITH DIFFERENTIAL/PLATELET  TROPONIN I (HIGH SENSITIVITY)  TROPONIN I (HIGH SENSITIVITY)     EKG  ED ECG REPORT I, Chesley Noon, the attending physician, personally viewed and interpreted this ECG.   Date: 06/22/2023  EKG Time: 10:27  Rate: 91  Rhythm: normal sinus rhythm  Axis: Normal  Intervals:right bundle branch block  ST&T Change: None  RADIOLOGY Ct head reviewed and interpreted by me with no hemorrhage or midline shift.  PROCEDURES:  Critical Care performed: No  Procedures   MEDICATIONS ORDERED IN ED: Medications  cefTRIAXone (ROCEPHIN) 1 g in sodium chloride 0.9 % 100 mL IVPB (has no administration in time range)     IMPRESSION / MDM / ASSESSMENT AND PLAN / ED COURSE  I reviewed the triage vital signs and the nursing notes.                              89 y.o. male with past medical history of hypertension, CAD status post CABG, and SVT who presents to the ED for altered mental status after he was found minimally  responsive in his recliner this morning.  Patient's presentation is most consistent with acute presentation with potential threat to life or bodily function.  Differential diagnosis includes, but is not limited to, stroke, TIA, seizure, UTI, sepsis, arrhythmia, ACS, anemia, electrolyte abnormality, AKI.  Patient nontoxic-appearing and in no acute distress, vital signs are unremarkable.  He is alert but appears disoriented, states that he is in his own bed and that the year is 2028.  He does not have any focal neurologic deficits on exam, stroke seems less likely but even so he would be outside the window for intervention given last known well time of last night.  Seizure considered given his improving mental status but no witnessed seizure activity.  We will check CT head and labs, EKG shows right bundle branch block similar to previous with no ischemic changes.  CT head and chest x-ray are negative for acute process, labs show no significant anemia, leukocytosis, electrolyte abnormality, or AKI.  LFTs are unremarkable, troponin within normal limits.  Patient's confusion has steadily improved and he is now able to accurately state the year.  Urinalysis does appear concerning for UTI, we will send for culture and treat with IV antibiotics.  Case discussed with hospitalist for admission.      FINAL CLINICAL IMPRESSION(S) / ED DIAGNOSES   Final diagnoses:  Altered mental status, unspecified altered mental status type  Lower urinary tract infectious disease     Rx / DC Orders   ED Discharge Orders     None        Note:  This document was prepared using Dragon voice recognition software and may include unintentional dictation errors.   Chesley Noon, MD 06/22/23 [phone]

## 2023-06-22 NOTE — ED Notes (Signed)
Attempted to get pt to provide urine sample, pt was unable to go at this time. Will try again later. Pt was however, able to stand at bedside without issue.

## 2023-06-22 NOTE — ED Triage Notes (Signed)
Pt BIB [facility] from Home for AMS. Family found pt this morning sitting in recliner and unresponsive. Fire department placed pt on floor, pt was incontinent of urine. EMS states pt would rouse to verbal stimulus for them and return to somnolent state after, pt becoming more alert en route. Pt had negative stroke screen with EMS. Pt has no complaints at this time, denies any pain. Vitals WNL, BP 162/80. Pt does have a pace maker, denies any recent falls or blood thinners. Pt has hx of stroke, HTN, MI in 96. At baseline pt is Aox4 able to ambulate.

## 2023-06-22 NOTE — ED Notes (Signed)
 Pt has returned from CT.

## 2023-06-22 NOTE — Assessment & Plan Note (Addendum)
History of CAD s/p CABG in 1997, with NSTEMI in 2022.  No chest pain at this time and troponin is negative.  - Continue home aspirin and statin

## 2023-06-22 NOTE — ED Notes (Signed)
Pt given warm blanket and coke to drink

## 2023-06-22 NOTE — Assessment & Plan Note (Signed)
After NSTEMI 2022, echocardiogram demonstrated regional wall motion abnormalities.  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  No symptoms of hypervolemia and patient is not on diuretics at home.  - Daily weights

## 2023-06-22 NOTE — ED Notes (Signed)
Pt not wanting to eat much. Pt ate fruit cup and a bit or two of [place] and refused the rest

## 2023-06-22 NOTE — H&P (Signed)
History and Physical    [name]:[id number] DOB: 1934-03-22 DOA: 06/22/2023 DOS: the patient was seen and examined on 06/22/2023 [facility]: [facility]  Patient coming from: Home  Chief Complaint:  Chief Complaint  Patient presents with   Altered Mental Status   HPI: [name] is a 89 y.o. male with medical history significant of CAD s/p CABG (1997), hypertension, SVT, HFrEF with last EF of 45% (April 2023), BPH, gout, who presents to the ED due to altered mental status.  History obtained from patient's son at bedside.  Patient's son states that he checked on his father earlier today and noticed that he was slumped over in his recliner.  When he was trying to wake him up, he kept making weird movements with his mouth and signing out loud but was not otherwise responsive.  Due to this EMS was called.  At baseline, patient is alert and oriented x 4 and lives independently.  ED course: On arrival to the ED, patient was hypertensive at 161/85 with heart rate of 94.  He was saturating at 94% on room air.  He was afebrile at 98.5.Initial workup notable for normal CBC, unremarkable CMP, normal troponin, however UA concerning for UTI with moderate leukocytosis, positive nitrites and few bacteria.  CT head with no acute findings.  Chest x-ray with no acute findings.  Patient started on Rocephin.  TRH contacted for admission.  Review of Systems: As mentioned in the history of present illness. All other systems reviewed and are negative.  Past Medical History:  Diagnosis Date   CAD (coronary artery disease)    Hypertension    Past Surgical History:  Procedure Laterality Date   LEFT HEART CATH AND CORS/GRAFTS ANGIOGRAPHY N/A 04/13/2021   Procedure: LEFT HEART CATH AND CORS/GRAFTS ANGIOGRAPHY;  Surgeon: Yvonne Kendall, MD;  Location: [facility] INVASIVE CV LAB;  Service: Cardiovascular;  Laterality: N/A;   Social History:  reports that he has never smoked. He has never used  smokeless tobacco. He reports that he does not drink alcohol and does not use drugs.  Allergies  Allergen Reactions   Simvastatin Other (See Comments)   Niacin Other (See Comments) and Rash    History reviewed. No pertinent family history.  Prior to Admission medications   Medication Sig Start Date End Date Taking? Authorizing Provider  allopurinol (ZYLOPRIM) 100 MG tablet Take 200 mg by mouth daily. To lower uric acid levels and prevent gout 11/23/20   [provider]  aspirin 81 MG chewable tablet Chew 81 mg by mouth daily. 10/05/06   [provider]  atorvastatin (LIPITOR) 80 MG tablet Take 40 mg by mouth at bedtime. 11/23/20   [provider]  cephALEXin (KEFLEX) 500 MG capsule Take 1 capsule (500 mg total) by mouth 2 (two) times daily. 10/22/21   Jene Every, MD  dorzolamide-timolol (COSOPT) 22.3-6.8 MG/ML ophthalmic solution Place 1 drop into both eyes 2 (two) times daily. 10/18/20   [provider]  hydrochlorothiazide (HYDRODIURIL) 25 MG tablet Take 12.5 mg by mouth daily. 11/23/20   [provider]  latanoprost (XALATAN) 0.005 % ophthalmic solution Place 1 drop into both eyes at bedtime. 10/18/20   [provider]  lidocaine (XYLOCAINE) 2 % jelly Place 1 Application into the urethra as needed. 11/08/21   Poggi, Eileen Stanford E, PA-C  lisinopril (ZESTRIL) 40 MG tablet Take 40 mg by mouth daily. 11/23/20   [provider]  metoprolol tartrate (LOPRESSOR) 100 MG tablet Take 100 mg by  mouth every 12 (twelve) hours. 11/23/20   [provider]  vitamin B-12 (CYANOCOBALAMIN) 1000 MCG tablet Take 1 tablet (1,000 mcg total) by mouth daily. 04/14/21   Lurene Shadow, MD    Physical Exam: Vitals:   06/22/23 1025 06/22/23 1028 06/22/23 1028  BP: (!) 161/85    Pulse: 91    Resp: (!) 22    Temp:   98.5 F (36.9 C)  TempSrc:   Oral  SpO2: 94%    Weight:  127 kg   Height:  6\' 2"  (1.88 m)    Physical Exam Vitals and nursing note  reviewed.  Constitutional:      General: He is not in acute distress. HENT:     Head: Normocephalic and atraumatic.     Mouth/Throat:     Mouth: Mucous membranes are moist.     Pharynx: Oropharynx is clear.  Cardiovascular:     Rate and Rhythm: Normal rate and regular rhythm.  Pulmonary:     Effort: Pulmonary effort is normal. No respiratory distress.     Breath sounds: Normal breath sounds. No wheezing or rales.  Abdominal:     General: Bowel sounds are normal. There is no distension.     Palpations: Abdomen is soft.     Tenderness: There is no abdominal tenderness. There is no guarding.  Musculoskeletal:     Right lower leg: No edema.     Left lower leg: No edema.  Skin:    General: Skin is warm and dry.  Neurological:     Mental Status: He is alert.     Comments:  Patient is alert and interactive, however disoriented x 4. Repeats statements, such as "I'm good," and "that's all I need"  Moving all extremities against gravity and equally.  No tremors.    Data Reviewed: CBC with WBC of 10.5, hemoglobin of 14.3, platelets of 154 CMP with sodium of 136, potassium 4.0, bicarb 23, glucose 109, BUN 19, creat 1.15, AST 23, ALT 21, GFR above 60 Troponin 12 Urinalysis with moderate leukocytes, positive nitrites, proteinuria, few bacteria, and over 50 WBC/hpf  EKG personally reviewed.  Sinus rhythm with rate of 91.  Right bundle branch block.  Compared to EKG obtained in 2022.  Right bundle branch block is not new.  CT Head Wo Contrast Result Date: 06/22/2023 CLINICAL DATA:  89 year old male with history of altered mental status. Found down on the floor. EXAM: CT HEAD WITHOUT CONTRAST TECHNIQUE: Contiguous axial images were obtained from the base of the skull through the vertex without intravenous contrast. RADIATION DOSE REDUCTION: This exam was performed according to the departmental dose-optimization program which includes automated exposure control, adjustment of the mA and/or kV  according to patient size and/or use of iterative reconstruction technique. COMPARISON:  Head CT 04/13/2021. FINDINGS: Brain: Moderate cerebral and mild cerebellar atrophy. Patchy and confluent areas of decreased attenuation are noted throughout the deep and periventricular white matter of the cerebral hemispheres bilaterally, compatible with chronic microvascular ischemic disease. No evidence of acute infarction, hemorrhage, hydrocephalus, extra-axial collection or mass lesion/mass effect. Vascular: No hyperdense vessel or unexpected calcification. Skull: Normal. Negative for fracture or focal lesion. Sinuses/Orbits: No acute finding. Mucosal retention cyst or polyp in the right maxillary sinus. Other: None. IMPRESSION: 1. No acute intracranial abnormalities. 2. Moderate cerebral and mild cerebellar atrophy with extensive chronic microvascular ischemic changes in the cerebral white matter, as above. Electronically Signed   By: Trudie Reed M.D.   On: 06/22/2023 11:01  DG Chest Portable 1 View Result Date: 06/22/2023 CLINICAL DATA:  Altered mental status EXAM: PORTABLE CHEST 1 VIEW COMPARISON:  04/12/2021 FINDINGS: Cardiomegaly status post sternotomy and CABG. Aortic atherosclerosis. No focal airspace consolidation, pleural effusion, or pneumothorax. IMPRESSION: Cardiomegaly. No acute cardiopulmonary findings. Electronically Signed   By: Duanne Guess D.O.   On: 06/22/2023 10:47   There are no new results to review at this time.  Assessment and Plan:  Altered mental status Patient presenting with 1 day of altered mental status, with no improvement since arriving to the ED, suspected to be delirium in the setting of underlying infection CT head negative.  No focal neurological findings on examination other than disorientation.  - Management of UTI as noted below - Monitor for urinary retention - Avoid centrally acting medications - Delirium precautions - PT/OT  Acute cystitis Urinalysis  concerning for urinary tract infection in the setting of moderate leukocytes, nitrites and bacteria.  - Continue Rocephin 1 g daily - Urine culture pending  Cardiomyopathy (HCC) After NSTEMI 2022, echocardiogram demonstrated regional wall motion abnormalities.  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  No symptoms of hypervolemia and patient is not on diuretics at home.  - Daily weights  Essential hypertension - Continue home regimen  CAD (coronary artery disease) History of CAD s/p CABG in 1997, with NSTEMI in 2022.  No chest pain at this time and troponin is negative.  - Continue home aspirin and statin  Advance Care Planning:   Code Status: Full Code per patient's son at bedside  Consults: None  Family Communication: Patient's son updated at bedside  Severity of Illness: The appropriate patient status for this patient is OBSERVATION. Observation status is judged to be reasonable and necessary in order to provide the required intensity of service to ensure the patient's safety. The patient's presenting symptoms, physical exam findings, and initial radiographic and laboratory data in the context of their medical condition is felt to place them at decreased risk for further clinical deterioration. Furthermore, it is anticipated that the patient will be medically stable for discharge from the hospital within 2 midnights of admission.   Author: Verdene Lennert, MD 06/22/2023 1:15 PM  For on call review [email or web address].

## 2023-06-22 NOTE — Assessment & Plan Note (Signed)
Urinalysis concerning for urinary tract infection in the setting of moderate leukocytes, nitrites and bacteria.  - Continue Rocephin 1 g daily - Urine culture pending

## 2023-06-22 NOTE — Assessment & Plan Note (Signed)
 -  Continue home regimen

## 2023-06-23 ENCOUNTER — Encounter: Payer: Self-pay | Admitting: Internal Medicine

## 2023-06-23 DIAGNOSIS — Z79899 Other long term (current) drug therapy: Secondary | ICD-10-CM | POA: Diagnosis not present

## 2023-06-23 DIAGNOSIS — R4182 Altered mental status, unspecified: Secondary | ICD-10-CM | POA: Diagnosis present

## 2023-06-23 DIAGNOSIS — Z951 Presence of aortocoronary bypass graft: Secondary | ICD-10-CM | POA: Diagnosis not present

## 2023-06-23 DIAGNOSIS — I10 Essential (primary) hypertension: Secondary | ICD-10-CM | POA: Diagnosis not present

## 2023-06-23 DIAGNOSIS — Z888 Allergy status to other drugs, medicaments and biological substances status: Secondary | ICD-10-CM | POA: Diagnosis not present

## 2023-06-23 DIAGNOSIS — I110 Hypertensive heart disease with heart failure: Secondary | ICD-10-CM | POA: Diagnosis present

## 2023-06-23 DIAGNOSIS — R404 Transient alteration of awareness: Secondary | ICD-10-CM

## 2023-06-23 DIAGNOSIS — I252 Old myocardial infarction: Secondary | ICD-10-CM | POA: Diagnosis not present

## 2023-06-23 DIAGNOSIS — B9620 Unspecified Escherichia coli [E. coli] as the cause of diseases classified elsewhere: Secondary | ICD-10-CM | POA: Diagnosis present

## 2023-06-23 DIAGNOSIS — N390 Urinary tract infection, site not specified: Secondary | ICD-10-CM | POA: Diagnosis present

## 2023-06-23 DIAGNOSIS — I255 Ischemic cardiomyopathy: Secondary | ICD-10-CM | POA: Diagnosis not present

## 2023-06-23 DIAGNOSIS — I2510 Atherosclerotic heart disease of native coronary artery without angina pectoris: Secondary | ICD-10-CM | POA: Diagnosis present

## 2023-06-23 DIAGNOSIS — I429 Cardiomyopathy, unspecified: Secondary | ICD-10-CM | POA: Diagnosis present

## 2023-06-23 DIAGNOSIS — I5022 Chronic systolic (congestive) heart failure: Secondary | ICD-10-CM | POA: Diagnosis present

## 2023-06-23 DIAGNOSIS — G9341 Metabolic encephalopathy: Secondary | ICD-10-CM | POA: Diagnosis present

## 2023-06-23 DIAGNOSIS — Z7982 Long term (current) use of aspirin: Secondary | ICD-10-CM | POA: Diagnosis not present

## 2023-06-23 DIAGNOSIS — N3001 Acute cystitis with hematuria: Secondary | ICD-10-CM | POA: Diagnosis not present

## 2023-06-23 DIAGNOSIS — N400 Enlarged prostate without lower urinary tract symptoms: Secondary | ICD-10-CM | POA: Diagnosis present

## 2023-06-23 DIAGNOSIS — Z1611 Resistance to penicillins: Secondary | ICD-10-CM | POA: Diagnosis present

## 2023-06-23 DIAGNOSIS — N3000 Acute cystitis without hematuria: Secondary | ICD-10-CM | POA: Diagnosis present

## 2023-06-23 LAB — CBC
HCT: 42.2 % (ref 39.0–52.0)
Hemoglobin: 13.9 g/dL (ref 13.0–17.0)
MCH: 30.3 pg (ref 26.0–34.0)
MCHC: 32.9 g/dL (ref 30.0–36.0)
MCV: 92.1 fL (ref 80.0–100.0)
Platelets: 154 10*3/uL (ref 150–400)
RBC: 4.58 MIL/uL (ref 4.22–5.81)
RDW: 14.8 % (ref 11.5–15.5)
WBC: 12.9 10*3/uL — ABNORMAL HIGH (ref 4.0–10.5)
nRBC: 0 % (ref 0.0–0.2)

## 2023-06-23 LAB — BASIC METABOLIC PANEL
Anion gap: 7 (ref 5–15)
BUN: 17 mg/dL (ref 8–23)
CO2: 25 mmol/L (ref 22–32)
Calcium: 8.7 mg/dL — ABNORMAL LOW (ref 8.9–10.3)
Chloride: 103 mmol/L (ref 98–111)
Creatinine, Ser: 1.05 mg/dL (ref 0.61–1.24)
GFR, Estimated: >60 mL/min (ref >60)
Glucose, Bld: 85 mg/dL (ref 70–99)
Potassium: 4 mmol/L (ref 3.5–5.1)
Sodium: 135 mmol/L (ref 135–145)

## 2023-06-23 NOTE — Evaluation (Signed)
Occupational Therapy Evaluation Patient Details Name: Steven Jacobson MRN: [id number] DOB: [birth year] Today's Date: 06/23/2023   History of Present Illness 89 y.o. male who presents to the ED due to altered mental status, admitted for possible UTI, acute cystitis, CAD.  PMHx: CAD s/p CABG (1997), hypertension, SVT, HFrEF with last EF of 45% (April 2023), BPH, gout.   Clinical Impression   Pt was seen for OT evaluation this date. Prior to hospital admission, pt was living at home with his son who works 2 jobs and pt reports mostly being alone. Pt reports IND at baseline without AD use for all tasks, still driving and community mobile. Walks to the mailbox to get mail, etc.  Pt presents to acute OT demonstrating impaired ADL performance and functional mobility 2/2 mild weakness (See OT problem list for additional functional deficits). Pt currently requires MOD I for bed mobility into high gurney, SUP for STS from EOB and SBA for in room mobility with no AD. He needs a brief change requiring Max A, likely Min/CGA for regular underwear. Was able to stand at toilet to perform peri-care including reaching down to get toilet paper off roll with SUP. Appears to have a small, shuffling gait, but no LOB noted during eval.  Of note, BP low following activity-nurse notified, but pt reports feeling normal. Pt seen on RA for eval with sp02 94-100%. Pt would benefit from skilled OT services to prevent functional decline/weakness while hospitalized, but do not anticipate the need for follow up OT services upon acute hospital DC.        If plan is discharge home, recommend the following: A little help with walking and/or transfers;A little help with bathing/dressing/bathroom;Assistance with cooking/housework;Help with stairs or ramp for entrance    Functional Status Assessment  Patient has had a recent decline in their functional status and demonstrates the ability to make significant improvements in function in a  reasonable and predictable amount of time.  Equipment Recommendations       Recommendations for Other Services       Precautions / Restrictions Precautions Precautions: Fall Restrictions Weight Bearing Restrictions Per Provider Order: No      Mobility Bed Mobility Overal bed mobility: Modified Independent             General bed mobility comments: seated EOB on arrival and able to return to supine with MOD I    Transfers Overall transfer level: Needs assistance   Transfers: Sit to/from Stand Sit to Stand: Supervision           General transfer comment: SUP for STS from EOB multiple trials and SUP/SBA for mobility in the room to toilet, door and back to EOB ; no LOB; shuffling gait per report he is at his baseline      Balance Overall balance assessment: Mild deficits observed, not formally tested                                         ADL either performed or assessed with clinical judgement   ADL Overall ADL's : Needs assistance/impaired Eating/Feeding: Set up;Sitting Eating/Feeding Details (indicate cue type and reason): seated EOB on arrival feeding self                 Lower Body Dressing: Maximal assistance Lower Body Dressing Details (indicate cue type and reason): to manage brief, however likely Min A/CGA for underwear  Toilet Transfer: Print production planner Details (indicate cue type and reason): simulated Toileting- Clothing Manipulation and Hygiene: Supervision/safety;Sit to/from stand Toileting - Clothing Manipulation Details (indicate cue type and reason): peri-care in standing with ability to bend down and get toilet paper with no LOB and no UE support during peri-care with SUP/SBA from therapist             Vision         Perception         Praxis         Pertinent Vitals/Pain Pain Assessment Pain Assessment: No/denies pain     Extremity/Trunk Assessment Upper  Extremity Assessment Upper Extremity Assessment: Overall WFL for tasks assessed   Lower Extremity Assessment Lower Extremity Assessment: Overall WFL for tasks assessed       Communication Communication Communication: No apparent difficulties   Cognition Arousal: Alert Behavior During Therapy: WFL for tasks assessed/performed Overall Cognitive Status: Within Functional Limits for tasks assessed                                       General Comments  BP 77/66 after activity, then up to 110/68; notified nurse and she will watch it; removed 02 to RA throughout session sp02 readings of 94-100%    Exercises Other Exercises Other Exercises: Edu on role of OT in acute setting.   Shoulder Instructions      Home Living Family/patient expects to be discharged to:: Private residence Living Arrangements: Children (son lives with him but works 2 jobs) Available Help at Discharge: Family;Available PRN/intermittently Type of Home: House Home Access: Stairs to enter [employer] of Steps: 3 Entrance Stairs-Rails: Right;Left;Can reach both Home Layout: One level     Bathroom Shower/Tub: Walk-in shower         Home Equipment: Grab bars - tub/shower;Cane - single point          Prior Functioning/Environment Prior Level of Function : Independent/Modified Independent;Driving             Mobility Comments: IND with no AD use; no falls ADLs Comments: IND with ADLs, IADLs, driving        OT Problem List: Decreased strength      OT Treatment/Interventions: Self-care/ADL training;Therapeutic exercise;Therapeutic activities;Energy conservation;Patient/family education;Balance training    OT Goals(Current goals can be found in the care plan section) Acute Rehab OT Goals Patient Stated Goal: go back home OT Goal Formulation: With patient Time For Goal Achievement: 07/07/23 Potential to Achieve Goals: Good ADL Goals Pt Will Perform Lower Body Bathing:  with set-up;with modified independence;sitting/lateral leans;sit to/from stand Pt Will Perform Lower Body Dressing: with modified independence;with set-up;sitting/lateral leans;sit to/from stand Pt Will Transfer to Toilet: with modified independence;regular height toilet;ambulating Pt Will Perform Toileting - Clothing Manipulation and hygiene: with modified independence;sit to/from stand;sitting/lateral leans  OT Frequency: Min 1X/week    Co-evaluation              AM-PAC OT "6 Clicks" Daily Activity     Outcome Measure Help from another person eating meals?: None Help from another person taking care of personal grooming?: None Help from another person toileting, which includes using toliet, bedpan, or urinal?: A Little Help from another person bathing (including washing, rinsing, drying)?: A Little Help from another person to put on and taking off regular upper body clothing?: None Help from another person to put on and taking off  regular lower body clothing?: A Little 6 Click Score: 21   End of Session Nurse Communication: Mobility status  Activity Tolerance: Patient tolerated treatment well Patient left: in bed;with call bell/phone within reach  OT Visit Diagnosis: Other abnormalities of gait and mobility (R26.89)                Time: 1610-9604 OT Time Calculation (min): 34 min Charges:  OT General Charges $OT Visit: 1 Visit OT Evaluation $OT Eval Moderate Complexity: 1 Mod OT Treatments $Self Care/Home Management : 8-22 mins Meggie Laseter, OTR/L  06/23/23, 10:38 AM  Constance Goltz 06/23/2023, 10:35 AM

## 2023-06-23 NOTE — TOC CM/SW Note (Signed)
Transition of Care [facility]) - Inpatient Brief Assessment   Patient Details  Name: Steven Jacobson MRN: [id number] Date of Birth: 10/22/1933  Transition of [facility]) CM/SW Contact:    Margarito Liner, LCSW Phone Number: 06/23/2023, 1:21 PM   Clinical Narrative: CSW reviewed chart. SDOH flag for social connections. CSW added resources to AVS. No other TOC needs identified at this time. CSW will continue to follow progress. Please place [facility] consult if any needs arise.  Transition of Care Asessment: Insurance and Status: Insurance coverage has been reviewed Patient has primary care physician: Yes Home environment has been reviewed: Single family home Prior level of function:: Independent Prior/Current Home Services: No current home services Social Drivers of Health Review: SDOH reviewed interventions complete Readmission risk has been reviewed: Yes Transition of care needs: no transition of care needs at this time

## 2023-06-23 NOTE — Discharge Instructions (Signed)
 the [facility] offers a [employer] that anyone can call toll free at [phone]. The friendship line is available 24 hours a day  [facility] is a Program of All-inclusive Care for the Elderly (PACE). Their mission is to promote and sustain the independence of seniors wishing to remain in the community. They provide seniors with comprehensive long-term health, social, medical and dietary care. Their program is a safe alternative to nursing home care. [phone]  [facility] Physical Address [facility] [address] [place], [place] [address] Phone: [phone]. . Online zoom yoga class, connect with others without leaving your home [facility] offers [place] dance cardio sessions for individuals via Zoom. This program provides: - Dance fitness activities Please contact program for more information. Servinganyone in need adults 18+ hiv/aids individuals families Call [phone]  Email [email or web address] [email or web address] to get more info  [employer] offers an online [employer] to individuals where they can receive help to focus on their best health. Whether you're a [employer] member or not, the neighborhood center offers a... Main Serviceshealth education  exercise & fitness  community support services  recreation  virtual support Other Servicessupport groups Servinganyone in need adults young adults teens seniors individuals families [email or web address] to get more info  Schedule on their website  The [facility] offers an array of activities for adults age 88 and over. This program provides:- Fitness and health programs- Tech classes- Activity books Main Serviceshealth education  community support services  exercise & fitness  recreation  more education Servingseniors  Call [phone]    For more resources go online to [email or web address] and type in you zipcode

## 2023-06-23 NOTE — Progress Notes (Signed)
  Progress Note   [name]:[id number] DOB: [birth year] DOA: 06/22/2023     0 DOS: the patient was seen and examined on 06/23/2023   Brief hospital course: Taken from H&P.   Steven Jacobson is a 89 y.o. male with medical history significant of CAD s/p CABG (1997), hypertension, SVT, HFrEF with last EF of 45% (April 2023), BPH, gout, who presents to the ED due to altered mental status.  Son found him slumped over in his recliner, when he tried to wake him up he was making weird movements and sounds.  On presentation labs and vital stable except UA concerning for UTI with moderate leukocytes, positive nitrite and few bacteria.CT head with no acute findings. Chest x-ray with no acute findings. Patient started on Rocephin.   2/3: Vital stable, labs with slight leukocytosis at 12.9 today.  Urine cultures pending.  Mentation seems improving. PT and OT with no follow-up recommendations.   Assessment and Plan: * Altered mental status Likely acute metabolic encephalopathy with underlying UTI. Mentation now improving and close to baseline. - Avoid centrally acting medications - Delirium precautions - PT/OT  Acute cystitis Urinalysis concerning for urinary tract infection in the setting of moderate leukocytes, nitrites and bacteria.  - Continue Rocephin  - Urine culture pending  Cardiomyopathy (HCC) After NSTEMI 2022, echocardiogram demonstrated regional wall motion abnormalities.  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  No symptoms of hypervolemia and patient is not on diuretics at home.  - Daily weights  CAD (coronary artery disease) History of CAD s/p CABG in 1997, with NSTEMI in 2022.  No chest pain at this time and troponin is negative.  - Continue home aspirin and statin  Essential hypertension - Continue home regimen   Subjective: Patient was eating lunch when seen today.  He thinks that he is close to baseline just feeling little  weak.  Son at bedside.  Physical Exam: Vitals:   06/23/23 1000 06/23/23 1015 06/23/23 1016 06/23/23 1030  BP: 129/62 129/75  128/61  Pulse: 79 66  69  Resp: 20 11  16   Temp:   98.4 F (36.9 C)   TempSrc:   Oral   SpO2: 98% 97%  98%  Weight:      Height:       General.  Frail elderly man, in no acute distress. Pulmonary.  Lungs clear bilaterally, normal respiratory effort. CV.  Regular rate and rhythm, no JVD, rub or murmur. Abdomen.  Soft, nontender, nondistended, BS positive. CNS.  Alert and oriented .  No focal neurologic deficit. Extremities.  No edema, no cyanosis, pulses intact and symmetrical.   Data Reviewed: Prior data reviewed  Family Communication: Discussed with son at bedside  Disposition: Status is: Inpatient Remains inpatient appropriate because: Severity of illness  Planned Discharge Destination: Home  Time spent: 45 minutes  This record has been created using Conservation officer, historic buildings. Errors have been sought and corrected,but may not always be located. Such creation errors do not reflect on the standard of care.   Author: Arnetha Courser, MD 06/23/2023 12:59 PM  For on call review [email or web address].

## 2023-06-23 NOTE — Hospital Course (Addendum)
Taken from H&P.   Steven Jacobson is a 89 y.o. male with medical history significant of CAD s/p CABG (1997), hypertension, SVT, HFrEF with last EF of 45% (April 2023), BPH, gout, who presents to the ED due to altered mental status.  Son found him slumped over in his recliner, when he tried to wake him up he was making weird movements and sounds.  On presentation labs and vital stable except UA concerning for UTI with moderate leukocytes, positive nitrite and few bacteria.CT head with no acute findings. Chest x-ray with no acute findings. Patient started on Rocephin.   2/3: Vital stable, labs with slight leukocytosis at 12.9 today.  Urine cultures pending.  Mentation seems improving. PT and OT with no follow-up recommendations.  2/4: Remained hemodynamically stable, urine cultures with E. coli which shows resistant to ampicillin, Augmentin, cefazolin, gentamicin, Zosyn and Bactrim.  Patient received ceftriaxone while in the hospital and is being discharged on nitrofurantoin for 3 more days to complete a 5-day course. Mentation at baseline.  His blood pressure was within goal without giving home hydrochlorothiazide and lisinopril, he can continue home medications but instructed to keep checking his blood pressure and discuss with his primary care provider for further recommendations.  Patient will continue on current medications and need assistance from primary care provider for further management.

## 2023-06-23 NOTE — Plan of Care (Signed)

## 2023-06-23 NOTE — Evaluation (Signed)
Physical Therapy Evaluation Patient Details Name: Steven Jacobson MRN: [id number] DOB: [birth year] Today's Date: 06/23/2023  History of Present Illness  Pt is an 89 y.o. male who presents to the ED due to altered mental status, admitted for possible UTI, acute cystitis, CAD.  PMHx: CAD s/p CABG (1997), hypertension, SVT, HFrEF with last EF of 45% (April 2023), BPH, gout.   Clinical Impression  Pt was pleasant and motivated to participate during the session and put forth good effort throughout. Pt required no physical assistance with any of the below functional tasks and was generally steady in static standing and during gait including with dynamic tasks such as start/stops and 180 deg turns.  Per OT pt presented with decrease in BP with change of position earlier this date although was asymptomatic. Orthostatic BPs taken during the session and were WNL with no adverse symptoms in any position and with no drop in systolic BP from sup to sit to standing.  Pt and son both agreed that pt is currently at or very near his baseline functionally and declined further PT services.  Will complete PT orders at this time but will reassess pt pending a change in status upon receipt of new PT orders.;          If plan is discharge home, recommend the following: Assist for transportation   Can travel by private vehicle        Equipment Recommendations None recommended by PT  Recommendations for Other Services       Functional Status Assessment Patient has not had a recent decline in their functional status     Precautions / Restrictions Precautions Precautions: Fall Restrictions Weight Bearing Restrictions Per Provider Order: No      Mobility  Bed Mobility Overal bed mobility: Modified Independent             General bed mobility comments: Min extra time and effort only    Transfers Overall transfer level: Independent                 General transfer comment: Good eccentric and  concentric control and stability    Ambulation/Gait Ambulation/Gait assistance: Independent Gait Distance (Feet): 40 Feet Assistive device: None Gait Pattern/deviations: Step-through pattern, Decreased step length - right, Decreased step length - left Gait velocity: decreased     General Gait Details: Mildly decreased cadence but steady without LOB including during start/stops and 180 deg turns without an AD  Stairs            Wheelchair Mobility     Tilt Bed    Modified Rankin (Stroke Patients Only)       Balance Overall balance assessment: No apparent balance deficits (not formally assessed)                                           Pertinent Vitals/Pain Pain Assessment Pain Assessment: No/denies pain    Home Living Family/patient expects to be discharged to:: Private residence Living Arrangements: Children Available Help at Discharge: Family;Available PRN/intermittently Type of Home: House Home Access: Stairs to enter Entrance Stairs-Rails: Right;Left;Can reach both Entrance Stairs-Number of Steps: 3   Home Layout: One level Home Equipment: Grab bars - tub/shower;Cane - single Librarian, academic (2 wheels) Additional Comments: Pt lives with son who works during the day    Prior Function Prior Level of Function : Independent/Modified Independent;Driving  Mobility Comments: IND amb without AD; no falls ADLs Comments: IND with ADLs, IADLs, driving     Extremity/Trunk Assessment   Upper Extremity Assessment Upper Extremity Assessment: Overall WFL for tasks assessed    Lower Extremity Assessment Lower Extremity Assessment: Overall WFL for tasks assessed       Communication   Communication Communication: Hearing impairment  Cognition Arousal: Alert Behavior During Therapy: WFL for tasks assessed/performed Overall Cognitive Status: Within Functional Limits for tasks assessed                                           General Comments General comments (skin integrity, edema, etc.): BP 77/66 after activity, then up to 110/68; notified nurse and she will watch it; removed 02 to RA throughout session sp02 readings of 94-100%    Exercises     Assessment/Plan    PT Assessment Patient does not need any further PT services  PT Problem List         PT Treatment Interventions      PT Goals (Current goals can be found in the Care Plan section)  Acute Rehab PT Goals PT Goal Formulation: All assessment and education complete, DC therapy    Frequency       Co-evaluation               AM-PAC PT "6 Clicks" Mobility  Outcome Measure Help needed turning from your back to your side while in a flat bed without using bedrails?: None Help needed moving from lying on your back to sitting on the side of a flat bed without using bedrails?: None Help needed moving to and from a bed to a chair (including a wheelchair)?: None Help needed standing up from a chair using your arms (e.g., wheelchair or bedside chair)?: None Help needed to walk in hospital room?: None Help needed climbing 3-5 steps with a railing? : None 6 Click Score: 24    End of Session Equipment Utilized During Treatment: Gait belt Activity Tolerance: Patient tolerated treatment well Patient left: in bed;with call bell/phone within reach;with family/visitor present;Other (comment) (Bilateral ED bed rails up as found) Nurse Communication: Mobility status PT Visit Diagnosis: Difficulty in walking, not elsewhere classified (R26.2)    Time: 1610-9604 PT Time Calculation (min) (ACUTE ONLY): 23 min   Charges:   PT Evaluation $PT Eval Low Complexity: 1 Low   PT General Charges $$ ACUTE PT VISIT: 1 Visit        D. Scott Tarquin Welcher PT, DPT 06/23/23, 11:36 AM

## 2023-06-23 NOTE — ED Notes (Signed)
Patient assisted to use bedside commode 

## 2023-06-23 NOTE — ED Notes (Signed)
Pt continues to desat while sleeping. This RN initiated 1L of O2 for comfort while sleeping.

## 2023-06-23 NOTE — Progress Notes (Signed)
Occupational Therapy Treatment Patient Details Name: Steven Jacobson MRN: [id number] DOB: [birth year] Today's Date: 06/23/2023   History of present illness Pt is an 89 y.o. male who presents to the ED due to altered mental status, admitted for possible UTI, acute cystitis, CAD.  PMHx: CAD s/p CABG (1997), hypertension, SVT, HFrEF with last EF of 45% (April 2023), BPH, gout.   OT comments  Pt activated emergency call button, author in room to assist. Pt found standing with soiled brief around ankles, trying to turn off overhead light by tapping screws. OT redirects patient to return to seated to doff soiled brief, pt requiring mod multimodal cuing to perform due to external distraction. Once seated, doffs underwear with supervision, stands at sink to perform LB bathing/pericare with cues for safety as pt then attempts to stand on one leg while donning new underwear and reaching to answer phone simultaneously. No LOB, CGA for safety with max multimodal cuing to pull underwear over hips while pt orders dinner. Pt very pleasant and grateful for assistance. OT will continue to follow, do not anticipate OT needs at discharge.       If plan is discharge home, recommend the following:  A little help with walking and/or transfers;A little help with bathing/dressing/bathroom;Assistance with cooking/housework;Help with stairs or ramp for entrance         Precautions / Restrictions Precautions Precautions: Fall Restrictions Weight Bearing Restrictions Per Provider Order: No       Mobility Bed Mobility Overal bed mobility: Modified Independent                  Transfers Overall transfer level: Independent                           ADL either performed or assessed with clinical judgement   ADL Overall ADL's : Needs assistance/impaired     Grooming: Wash/dry hands;Standing       Lower Body Bathing: Contact guard assist;Cueing for safety;Sit to/from stand       Lower Body  Dressing: Contact guard assist;Cueing for safety;Sit to/from stand       Toileting- Architect and Hygiene: Minimal assistance;Sit to/from stand       Functional mobility during ADLs: Contact guard assist General ADL Comments: Decreased awareness of safety - pt found with brief around ankles trying to turn of light in room. OT redirects to assist pt with doffing soiled brief, pericare and donning new underwear      Cognition Arousal: Alert Behavior During Therapy: WFL for tasks assessed/performed Overall Cognitive Status: Within Functional Limits for tasks assessed                                                     Pertinent Vitals/ Pain       Pain Assessment Pain Assessment: No/denies pain   Frequency  Min 1X/week        Progress Toward Goals  OT Goals(current goals can now be found in the care plan section)     Acute Rehab OT Goals OT Goal Formulation: With patient Time For Goal Achievement: 07/07/23 Potential to Achieve Goals: Good ADL Goals Pt Will Perform Lower Body Bathing: with set-up;with modified independence;sitting/lateral leans;sit to/from stand Pt Will Perform Lower Body Dressing: with modified independence;with set-up;sitting/lateral leans;sit to/from stand Pt Will Transfer  to Toilet: with modified independence;regular height toilet;ambulating Pt Will Perform Toileting - Clothing Manipulation and hygiene: with modified independence;sit to/from stand;sitting/lateral leans  Plan         AM-PAC OT "6 Clicks" Daily Activity     Outcome Measure   Help from another person eating meals?: None Help from another person taking care of personal grooming?: None Help from another person toileting, which includes using toliet, bedpan, or urinal?: A Little Help from another person bathing (including washing, rinsing, drying)?: A Little Help from another person to put on and taking off regular upper body clothing?: None Help from  another person to put on and taking off regular lower body clothing?: A Little 6 Click Score: 21    End of Session    OT Visit Diagnosis: Other abnormalities of gait and mobility (R26.89)   Activity Tolerance Patient tolerated treatment well   Patient Left in bed;with call bell/phone within reach;with bed alarm set   Nurse Communication Mobility status        Time: 1610-9604 OT Time Calculation (min): 11 min  Charges: OT General Charges $OT Visit: 1 Visit OT Treatments $Self Care/Home Management : 8-22 mins  Steven Jacobson, OTR/L  06/23/23, 4:00 PM

## 2023-06-24 DIAGNOSIS — R404 Transient alteration of awareness: Secondary | ICD-10-CM | POA: Diagnosis not present

## 2023-06-24 DIAGNOSIS — N3001 Acute cystitis with hematuria: Secondary | ICD-10-CM | POA: Diagnosis not present

## 2023-06-24 DIAGNOSIS — I2510 Atherosclerotic heart disease of native coronary artery without angina pectoris: Secondary | ICD-10-CM | POA: Diagnosis not present

## 2023-06-24 DIAGNOSIS — I10 Essential (primary) hypertension: Secondary | ICD-10-CM | POA: Diagnosis not present

## 2023-06-24 LAB — BASIC METABOLIC PANEL
Anion gap: 12 (ref 5–15)
BUN: 17 mg/dL (ref 8–23)
CO2: 22 mmol/L (ref 22–32)
Calcium: 8.7 mg/dL — ABNORMAL LOW (ref 8.9–10.3)
Chloride: 101 mmol/L (ref 98–111)
Creatinine, Ser: 1.09 mg/dL (ref 0.61–1.24)
GFR, Estimated: >60 mL/min (ref >60)
Glucose, Bld: 96 mg/dL (ref 70–99)
Potassium: 3.9 mmol/L (ref 3.5–5.1)
Sodium: 135 mmol/L (ref 135–145)

## 2023-06-24 LAB — URINE CULTURE: Culture: >=100000 — AB

## 2023-06-24 LAB — CBC
HCT: 41 % (ref 39.0–52.0)
Hemoglobin: 13.8 g/dL (ref 13.0–17.0)
MCH: 30.1 pg (ref 26.0–34.0)
MCHC: 33.7 g/dL (ref 30.0–36.0)
MCV: 89.5 fL (ref 80.0–100.0)
Platelets: 162 10*3/uL (ref 150–400)
RBC: 4.58 MIL/uL (ref 4.22–5.81)
RDW: 14.8 % (ref 11.5–15.5)
WBC: 8.9 10*3/uL (ref 4.0–10.5)
nRBC: 0 % (ref 0.0–0.2)

## 2023-06-24 MED ORDER — NITROFURANTOIN MONOHYD MACRO 100 MG PO CAPS
100.0000 mg | ORAL_CAPSULE | Freq: Two times a day (BID) | ORAL | Status: DC
  Filled 2023-06-24: qty 1

## 2023-06-24 MED ORDER — NITROFURANTOIN MONOHYD MACRO 100 MG PO CAPS: 100.0000 mg | ORAL_CAPSULE | Freq: Two times a day (BID) | ORAL | 0 refills | Status: AC

## 2023-06-24 MED ORDER — NITROFURANTOIN MONOHYD MACRO 100 MG PO CAPS: 100.0000 mg | ORAL_CAPSULE | Freq: Two times a day (BID) | ORAL | 0 refills | Status: DC

## 2023-06-24 NOTE — Discharge Summary (Signed)
 Physician Discharge Summary   Patient: Steven Jacobson MRN: [id number] DOB: 11/09/1933  Admit date:     06/22/2023  Discharge date: 06/24/23  Discharge Physician: Amaryllis Dare   PCP: [facility], [facility]   Recommendations at discharge:  Please obtain CBC and BMP on follow-up Please ensure completion of antibiotics Follow-up with primary care provider within a week  Discharge Diagnoses: Principal Problem:   Altered mental status Active Problems:   Acute cystitis   Cardiomyopathy (HCC)   CAD (coronary artery disease)   Essential hypertension   UTI (urinary tract infection)   Hospital Course: Taken from H&P.   Steven Jacobson is a 89 y.o. male with medical history significant of CAD s/p CABG (1997), hypertension, SVT, HFrEF with last EF of 45% (April 2023), BPH, gout, who presents to the ED due to altered mental status.  Son found him slumped over in his recliner, when he tried to wake him up he was making weird movements and sounds.  On presentation labs and vital stable except UA concerning for UTI with moderate leukocytes, positive nitrite and few bacteria.CT head with no acute findings. Chest x-ray with no acute findings. Patient started on Rocephin .   2/3: Vital stable, labs with slight leukocytosis at 12.9 today.  Urine cultures pending.  Mentation seems improving. PT and OT with no follow-up recommendations.  2/4: Remained hemodynamically stable, urine cultures with E. coli which shows resistant to ampicillin, Augmentin, cefazolin, gentamicin, Zosyn and Bactrim.  Patient received ceftriaxone  while in the hospital and is being discharged on nitrofurantoin  for 3 more days to complete a 5-day course. Mentation at baseline.  His blood pressure was within goal without giving home hydrochlorothiazide  and lisinopril , he can continue home medications but instructed to keep checking his blood pressure and discuss with his primary care provider for further recommendations.  Patient will  continue on current medications and need assistance from primary care provider for further management.  Assessment and Plan: * Altered mental status Likely acute metabolic encephalopathy with underlying UTI. Mentation now improving and close to baseline. - Avoid centrally acting medications - Delirium precautions - PT/OT  Acute cystitis Urinalysis concerning for urinary tract infection in the setting of moderate leukocytes, nitrites and bacteria. Urine cultures with E. Coli. -Received ceftriaxone  while in the hospital and is being discharged on Macrobid  to complete a 5-day course  Cardiomyopathy [facility]) After NSTEMI 2022, echocardiogram demonstrated regional wall motion abnormalities.  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  No symptoms of hypervolemia and patient is not on diuretics at home.  - Daily weights  CAD (coronary artery disease) History of CAD s/p CABG in 1997, with NSTEMI in 2022.  No chest pain at this time and troponin is negative.  - Continue home aspirin  and statin  Essential hypertension - Continue home regimen   Consultants: None Procedures performed: None Disposition: Home Diet recommendation:  Discharge Diet Orders (From admission, onward)     Start     Ordered   06/24/23 0000  Diet - low sodium heart healthy        06/24/23 1053           Cardiac diet DISCHARGE MEDICATION: Allergies as of 06/24/2023       Reactions   Simvastatin Other (See Comments)   Muscle pain   Niacin Other (See Comments), Rash        Medication List     STOP taking these medications    lidocaine  2 %  jelly Commonly known as: XYLOCAINE        TAKE these medications    allopurinol  100 MG tablet Commonly known as: ZYLOPRIM  Take 200 mg by mouth daily. To lower uric acid levels and prevent gout   aspirin  81 MG chewable tablet Chew 81 mg by mouth daily.   atorvastatin  80 MG tablet Commonly known as: LIPITOR Take 40 mg  by mouth at bedtime.   clopidogrel  75 MG tablet Commonly known as: PLAVIX  Take 75 mg by mouth daily.   cyanocobalamin  1000 MCG tablet Commonly known as: VITAMIN B12 Take 1 tablet (1,000 mcg total) by mouth daily.   dorzolamide -timolol  2-0.5 % ophthalmic solution Commonly known as: COSOPT  Place 1 drop into both eyes 2 (two) times daily.   hydrochlorothiazide  25 MG tablet Commonly known as: HYDRODIURIL  Take 12.5 mg by mouth daily.   latanoprost  0.005 % ophthalmic solution Commonly known as: XALATAN  Place 1 drop into both eyes at bedtime.   lisinopril  40 MG tablet Commonly known as: ZESTRIL  Take 40 mg by mouth daily.   metoprolol  tartrate 100 MG tablet Commonly known as: LOPRESSOR  Take 100 mg by mouth every 12 (twelve) hours.   nitrofurantoin  (macrocrystal-monohydrate) 100 MG capsule Commonly known as: MACROBID  Take 1 capsule (100 mg total) by mouth every 12 (twelve) hours for 3 days.        Follow-up Information     Center, [facility]. Schedule an appointment as soon as possible for a visit in 1 week(s).   Specialty: General Practice Contact information: [address] [place] [address] [phone]                Discharge Exam: [name]   06/22/23 1028  Weight: 127 kg   General.  Frail elderly man, in no acute distress. Pulmonary.  Lungs clear bilaterally, normal respiratory effort. CV.  Regular rate and rhythm, no JVD, rub or murmur. Abdomen.  Soft, nontender, nondistended, BS positive. CNS.  Alert and oriented .  No focal neurologic deficit. Extremities.  No edema, no cyanosis, pulses intact and symmetrical. Psychiatry.  Judgment and insight appears normal.   Condition at discharge: stable  The results of significant diagnostics from this hospitalization (including imaging, microbiology, ancillary and laboratory) are listed below for reference.   Imaging Studies: CT Head Wo Contrast Result Date: 06/22/2023 CLINICAL DATA:  89 year old  male with history of altered mental status. Found down on the floor. EXAM: CT HEAD WITHOUT CONTRAST TECHNIQUE: Contiguous axial images were obtained from the base of the skull through the vertex without intravenous contrast. RADIATION DOSE REDUCTION: This exam was performed according to the departmental dose-optimization program which includes automated exposure control, adjustment of the mA and/or kV according to patient size and/or use of iterative reconstruction technique. COMPARISON:  Head CT 04/13/2021. FINDINGS: Brain: Moderate cerebral and mild cerebellar atrophy. Patchy and confluent areas of decreased attenuation are noted throughout the deep and periventricular white matter of the cerebral hemispheres bilaterally, compatible with chronic microvascular ischemic disease. No evidence of acute infarction, hemorrhage, hydrocephalus, extra-axial collection or mass lesion/mass effect. Vascular: No hyperdense vessel or unexpected calcification. Skull: Normal. Negative for fracture or focal lesion. Sinuses/Orbits: No acute finding. Mucosal retention cyst or polyp in the right maxillary sinus. Other: None. IMPRESSION: 1. No acute intracranial abnormalities. 2. Moderate cerebral and mild cerebellar atrophy with extensive chronic microvascular ischemic changes in the cerebral white matter, as above. Electronically Signed   By: Toribio Aye M.D.   On: 06/22/2023 11:01   DG Chest Portable  1 View Result Date: 06/22/2023 CLINICAL DATA:  Altered mental status EXAM: PORTABLE CHEST 1 VIEW COMPARISON:  04/12/2021 FINDINGS: Cardiomegaly status post sternotomy and CABG. Aortic atherosclerosis. No focal airspace consolidation, pleural effusion, or pneumothorax. IMPRESSION: Cardiomegaly. No acute cardiopulmonary findings. Electronically Signed   By: Mabel Converse D.O.   On: 06/22/2023 10:47    Microbiology: Results for orders placed or performed during the hospital encounter of 06/22/23  Urine Culture     Status:  Abnormal   Collection Time: 06/22/23 12:14 PM   Specimen: Urine, Clean Catch  Result Value Ref Range Status   Specimen Description   Final    URINE, CLEAN CATCH Performed at [facility], [address] [place], [place] [address]    Special Requests   Final    NONE Performed at [facility], [address] [place], [place] [address]    Culture >=100,000 COLONIES/mL ESCHERICHIA COLI (A)  Final   Report Status 06/24/2023 FINAL  Final   Organism ID, Bacteria ESCHERICHIA COLI (A)  Final      Susceptibility   Escherichia coli - MIC*    AMPICILLIN >=32 RESISTANT Resistant     CEFAZOLIN 32 INTERMEDIATE Intermediate     CEFEPIME <=0.12 SENSITIVE Sensitive     CEFTRIAXONE  <=0.25 SENSITIVE Sensitive     CIPROFLOXACIN <=0.25 SENSITIVE Sensitive     GENTAMICIN >=16 RESISTANT Resistant     IMIPENEM <=0.25 SENSITIVE Sensitive     NITROFURANTOIN  <=16 SENSITIVE Sensitive     TRIMETH/SULFA >=320 RESISTANT Resistant     AMPICILLIN/SULBACTAM >=32 RESISTANT Resistant     PIP/TAZO >=128 RESISTANT Resistant ug/mL    * >=100,000 COLONIES/mL ESCHERICHIA COLI    Labs: CBC: Recent Labs  Lab 06/22/23 1032 06/23/23 0455 06/24/23 0957  WBC 10.5 12.9* 8.9  NEUTROABS 6.7  --   --   HGB 14.3 13.9 13.8  HCT 43.8 42.2 41.0  MCV 92.4 92.1 89.5  PLT 154 154 162   Basic Metabolic Panel: Recent Labs  Lab 06/22/23 1032 06/23/23 0455 06/24/23 0957  NA 136 135 135  K 4.0 4.0 3.9  CL 103 103 101  CO2 23 25 22   GLUCOSE 109* 85 96  BUN 19 17 17   CREATININE 1.15 1.05 1.09  CALCIUM  8.8* 8.7* 8.7*   Liver Function Tests: Recent Labs  Lab 06/22/23 1032  AST 23  ALT 21  ALKPHOS 63  BILITOT 1.3*  PROT 6.6  ALBUMIN 3.7   CBG: No results for input(s): GLUCAP in the last 168 hours.  Discharge time spent: greater than 30 minutes.  This record has been created using Conservation officer, historic buildings. Errors have been sought and corrected,but may not always be located.  Such creation errors do not reflect on the standard of care.   Signed: Amaryllis Dare, MD [facility] 06/24/2023

## 2023-06-24 NOTE — Plan of Care (Signed)

## 2023-06-24 NOTE — Plan of Care (Signed)

## 2023-06-24 NOTE — Progress Notes (Signed)
 Mobility Specialist - Progress Note   06/24/23 1100  Mobility  Activity Ambulated with assistance in hallway  Level of Assistance Standby assist, set-up cues, supervision of patient - no hands on  Assistive Device Front wheel walker;None  Distance Ambulated (ft) 160 ft  Activity Response Tolerated well  Mobility visit 1 Mobility     Pt lying in bed upon arrival, utilizing RA. Pt agreeable to activity. Completed bed mobility modI. Mild dizziness in standing. STS and ambulation minG with RW; CGA without RW. No LOB. Pt left in bed with alarm set, needs in reach.    Lennette Seip Mobility Specialist 06/24/23, 11:34 AM

## 2023-07-17 ENCOUNTER — Encounter: Payer: Self-pay | Admitting: Family Medicine

## 2023-07-18 ENCOUNTER — Emergency Department: Payer: Medicare Other

## 2023-07-18 ENCOUNTER — Other Ambulatory Visit: Payer: Self-pay

## 2023-07-18 ENCOUNTER — Inpatient Hospital Stay
Admission: EM | Admit: 2023-07-18 | Discharge: 2023-07-25 | DRG: 871 | Disposition: A | Discharge status: Hospice | Payer: Medicare Other | Attending: Internal Medicine | Admitting: Internal Medicine

## 2023-07-18 DIAGNOSIS — A419 Sepsis, unspecified organism: Secondary | ICD-10-CM | POA: Diagnosis present

## 2023-07-18 DIAGNOSIS — E785 Hyperlipidemia, unspecified: Secondary | ICD-10-CM | POA: Diagnosis present

## 2023-07-18 DIAGNOSIS — F0390 Unspecified dementia without behavioral disturbance: Secondary | ICD-10-CM | POA: Diagnosis present

## 2023-07-18 DIAGNOSIS — M109 Gout, unspecified: Secondary | ICD-10-CM | POA: Diagnosis present

## 2023-07-18 DIAGNOSIS — Z789 Other specified health status: Secondary | ICD-10-CM

## 2023-07-18 DIAGNOSIS — Z66 Do not resuscitate: Secondary | ICD-10-CM | POA: Diagnosis present

## 2023-07-18 DIAGNOSIS — E871 Hypo-osmolality and hyponatremia: Secondary | ICD-10-CM | POA: Diagnosis present

## 2023-07-18 DIAGNOSIS — Z951 Presence of aortocoronary bypass graft: Secondary | ICD-10-CM

## 2023-07-18 DIAGNOSIS — K921 Melena: Secondary | ICD-10-CM | POA: Diagnosis present

## 2023-07-18 DIAGNOSIS — Z5982 Transportation insecurity: Secondary | ICD-10-CM

## 2023-07-18 DIAGNOSIS — Z515 Encounter for palliative care: Secondary | ICD-10-CM | POA: Diagnosis not present

## 2023-07-18 DIAGNOSIS — I2510 Atherosclerotic heart disease of native coronary artery without angina pectoris: Secondary | ICD-10-CM | POA: Diagnosis present

## 2023-07-18 DIAGNOSIS — E669 Obesity, unspecified: Secondary | ICD-10-CM | POA: Diagnosis not present

## 2023-07-18 DIAGNOSIS — I110 Hypertensive heart disease with heart failure: Secondary | ICD-10-CM | POA: Diagnosis present

## 2023-07-18 DIAGNOSIS — D62 Acute posthemorrhagic anemia: Secondary | ICD-10-CM | POA: Diagnosis not present

## 2023-07-18 DIAGNOSIS — E041 Nontoxic single thyroid nodule: Secondary | ICD-10-CM | POA: Diagnosis present

## 2023-07-18 DIAGNOSIS — N400 Enlarged prostate without lower urinary tract symptoms: Secondary | ICD-10-CM | POA: Diagnosis present

## 2023-07-18 DIAGNOSIS — K55059 Acute (reversible) ischemia of intestine, part and extent unspecified: Secondary | ICD-10-CM | POA: Diagnosis present

## 2023-07-18 DIAGNOSIS — Z6830 Body mass index (BMI) 30.0-30.9, adult: Secondary | ICD-10-CM

## 2023-07-18 DIAGNOSIS — I10 Essential (primary) hypertension: Secondary | ICD-10-CM | POA: Diagnosis not present

## 2023-07-18 DIAGNOSIS — E872 Acidosis, unspecified: Secondary | ICD-10-CM | POA: Diagnosis present

## 2023-07-18 DIAGNOSIS — I5032 Chronic diastolic (congestive) heart failure: Secondary | ICD-10-CM | POA: Diagnosis present

## 2023-07-18 DIAGNOSIS — G9341 Metabolic encephalopathy: Secondary | ICD-10-CM | POA: Diagnosis present

## 2023-07-18 DIAGNOSIS — L89312 Pressure ulcer of right buttock, stage 2: Secondary | ICD-10-CM | POA: Diagnosis present

## 2023-07-18 DIAGNOSIS — E860 Dehydration: Secondary | ICD-10-CM | POA: Diagnosis present

## 2023-07-18 DIAGNOSIS — Z888 Allergy status to other drugs, medicaments and biological substances status: Secondary | ICD-10-CM

## 2023-07-18 DIAGNOSIS — I471 Supraventricular tachycardia, unspecified: Secondary | ICD-10-CM | POA: Diagnosis present

## 2023-07-18 DIAGNOSIS — Z7902 Long term (current) use of antithrombotics/antiplatelets: Secondary | ICD-10-CM

## 2023-07-18 DIAGNOSIS — I81 Portal vein thrombosis: Secondary | ICD-10-CM | POA: Diagnosis present

## 2023-07-18 DIAGNOSIS — R6520 Severe sepsis without septic shock: Secondary | ICD-10-CM | POA: Diagnosis present

## 2023-07-18 DIAGNOSIS — K55069 Acute infarction of intestine, part and extent unspecified: Secondary | ICD-10-CM | POA: Diagnosis present

## 2023-07-18 DIAGNOSIS — Z1152 Encounter for screening for COVID-19: Secondary | ICD-10-CM | POA: Diagnosis not present

## 2023-07-18 DIAGNOSIS — N179 Acute kidney failure, unspecified: Secondary | ICD-10-CM | POA: Diagnosis present

## 2023-07-18 DIAGNOSIS — N390 Urinary tract infection, site not specified: Secondary | ICD-10-CM | POA: Diagnosis present

## 2023-07-18 DIAGNOSIS — Z7189 Other specified counseling: Secondary | ICD-10-CM | POA: Diagnosis not present

## 2023-07-18 DIAGNOSIS — E66811 Obesity, class 1: Secondary | ICD-10-CM | POA: Diagnosis present

## 2023-07-18 DIAGNOSIS — N2889 Other specified disorders of kidney and ureter: Secondary | ICD-10-CM | POA: Diagnosis present

## 2023-07-18 DIAGNOSIS — J189 Pneumonia, unspecified organism: Principal | ICD-10-CM | POA: Diagnosis present

## 2023-07-18 DIAGNOSIS — Z7982 Long term (current) use of aspirin: Secondary | ICD-10-CM

## 2023-07-18 DIAGNOSIS — Z604 Social exclusion and rejection: Secondary | ICD-10-CM | POA: Diagnosis present

## 2023-07-18 DIAGNOSIS — R0902 Hypoxemia: Secondary | ICD-10-CM | POA: Diagnosis present

## 2023-07-18 DIAGNOSIS — Z79899 Other long term (current) drug therapy: Secondary | ICD-10-CM

## 2023-07-18 LAB — BRAIN NATRIURETIC PEPTIDE: B Natriuretic Peptide: 78.9 pg/mL (ref 0.0–100.0)

## 2023-07-18 LAB — COMPREHENSIVE METABOLIC PANEL
ALT: 33 U/L (ref 0–44)
AST: 44 U/L — ABNORMAL HIGH (ref 15–41)
Albumin: 2.1 g/dL — ABNORMAL LOW (ref 3.5–5.0)
Alkaline Phosphatase: 117 U/L (ref 38–126)
Anion gap: 12 (ref 5–15)
BUN: 54 mg/dL — ABNORMAL HIGH (ref 8–23)
CO2: 20 mmol/L — ABNORMAL LOW (ref 22–32)
Calcium: 8.1 mg/dL — ABNORMAL LOW (ref 8.9–10.3)
Chloride: 101 mmol/L (ref 98–111)
Creatinine, Ser: 1.67 mg/dL — ABNORMAL HIGH (ref 0.61–1.24)
GFR, Estimated: 39 mL/min — ABNORMAL LOW (ref >60)
Glucose, Bld: 122 mg/dL — ABNORMAL HIGH (ref 70–99)
Potassium: 4.5 mmol/L (ref 3.5–5.1)
Sodium: 133 mmol/L — ABNORMAL LOW (ref 135–145)
Total Bilirubin: 1.9 mg/dL — ABNORMAL HIGH (ref 0.0–1.2)
Total Protein: 6.8 g/dL (ref 6.5–8.1)

## 2023-07-18 LAB — RESP PANEL BY RT-PCR (RSV, FLU A&B, COVID)  RVPGX2
Influenza A by PCR: NEGATIVE
Influenza B by PCR: NEGATIVE
Resp Syncytial Virus by PCR: NEGATIVE
SARS Coronavirus 2 by RT PCR: NEGATIVE

## 2023-07-18 LAB — LACTIC ACID, PLASMA
Lactic Acid, Venous: 2.3 mmol/L (ref 0.5–1.9)
Lactic Acid, Venous: 2 mmol/L (ref 0.5–1.9)

## 2023-07-18 LAB — URINALYSIS, W/ REFLEX TO CULTURE (INFECTION SUSPECTED)
Bacteria, UA: NONE SEEN
Bilirubin Urine: NEGATIVE
Glucose, UA: NEGATIVE mg/dL
Ketones, ur: NEGATIVE mg/dL
Leukocytes,Ua: NEGATIVE
Nitrite: NEGATIVE
Protein, ur: 100 mg/dL — AB
RBC / HPF: >50 RBC/hpf (ref 0–5)
Specific Gravity, Urine: 1.018 (ref 1.005–1.030)
pH: 5 (ref 5.0–8.0)

## 2023-07-18 LAB — CBC WITH DIFFERENTIAL/PLATELET
Abs Immature Granulocytes: 0.87 10*3/uL — ABNORMAL HIGH (ref 0.00–0.07)
Basophils Absolute: 0.2 10*3/uL — ABNORMAL HIGH (ref 0.0–0.1)
Basophils Relative: 1 %
Eosinophils Absolute: 0 10*3/uL (ref 0.0–0.5)
Eosinophils Relative: 0 %
HCT: 40.2 % (ref 39.0–52.0)
Hemoglobin: 13.3 g/dL (ref 13.0–17.0)
Immature Granulocytes: 3 %
Lymphocytes Relative: 6 %
Lymphs Abs: 1.7 10*3/uL (ref 0.7–4.0)
MCH: 29.3 pg (ref 26.0–34.0)
MCHC: 33.1 g/dL (ref 30.0–36.0)
MCV: 88.5 fL (ref 80.0–100.0)
Monocytes Absolute: 1.7 10*3/uL — ABNORMAL HIGH (ref 0.1–1.0)
Monocytes Relative: 6 %
Neutro Abs: 25.4 10*3/uL — ABNORMAL HIGH (ref 1.7–7.7)
Neutrophils Relative %: 84 %
Platelets: 223 10*3/uL (ref 150–400)
RBC: 4.54 MIL/uL (ref 4.22–5.81)
RDW: 15.5 % (ref 11.5–15.5)
Smear Review: NORMAL
WBC: 29.9 10*3/uL — ABNORMAL HIGH (ref 4.0–10.5)
nRBC: 0 % (ref 0.0–0.2)

## 2023-07-18 LAB — LIPASE, BLOOD: Lipase: 52 U/L — ABNORMAL HIGH (ref 11–51)

## 2023-07-18 LAB — CK: Total CK: 49 U/L (ref 49–397)

## 2023-07-18 LAB — PROTIME-INR
INR: 1.4 — ABNORMAL HIGH (ref 0.8–1.2)
Prothrombin Time: 17.5 s — ABNORMAL HIGH (ref 11.4–15.2)

## 2023-07-18 LAB — MAGNESIUM: Magnesium: 2.4 mg/dL (ref 1.7–2.4)

## 2023-07-18 LAB — APTT: aPTT: 28 s (ref 24–36)

## 2023-07-18 MED ORDER — SODIUM CHLORIDE 0.9 % IV SOLN
2.0000 g | INTRAVENOUS | Status: DC
  Administered 2023-07-19 – 2023-07-22 (×4): 2 g via INTRAVENOUS
  Filled 2023-07-18 (×5): qty 20

## 2023-07-18 MED ORDER — METOPROLOL TARTRATE 5 MG/5ML IV SOLN
2.5000 mg | INTRAVENOUS | Status: DC | PRN
  Administered 2023-07-19: 2.5 mg via INTRAVENOUS
  Filled 2023-07-18: qty 5

## 2023-07-18 MED ORDER — HEPARIN (PORCINE) 25000 UT/250ML-% IV SOLN
2800.0000 [IU]/h | INTRAVENOUS | Status: DC
  Administered 2023-07-18: 1700 [IU]/h via INTRAVENOUS
  Administered 2023-07-19: 2100 [IU]/h via INTRAVENOUS
  Administered 2023-07-19: 2400 [IU]/h via INTRAVENOUS
  Administered 2023-07-20 (×2): 2600 [IU]/h via INTRAVENOUS
  Filled 2023-07-18 (×5): qty 250

## 2023-07-18 MED ORDER — SODIUM CHLORIDE 0.9 % IV BOLUS
1000.0000 mL | Freq: Once | INTRAVENOUS | Status: AC
  Administered 2023-07-18: 1000 mL via INTRAVENOUS

## 2023-07-18 MED ORDER — OXYCODONE HCL 5 MG PO TABS: 5.0000 mg | ORAL_TABLET | Freq: Four times a day (QID) | ORAL | Status: DC | PRN

## 2023-07-18 MED ORDER — LACTATED RINGERS IV SOLN: INTRAVENOUS | Status: DC

## 2023-07-18 MED ORDER — IOHEXOL 350 MG/ML SOLN
80.0000 mL | Freq: Once | INTRAVENOUS | Status: AC | PRN
  Administered 2023-07-18: 80 mL via INTRAVENOUS

## 2023-07-18 MED ORDER — LATANOPROST 0.005 % OP SOLN
1.0000 [drp] | Freq: Every day | OPHTHALMIC | Status: DC
  Administered 2023-07-20 – 2023-07-22 (×2): 1 [drp] via OPHTHALMIC
  Filled 2023-07-18 (×2): qty 2.5

## 2023-07-18 MED ORDER — SODIUM CHLORIDE 0.9 % IV SOLN
100.0000 mg | Freq: Two times a day (BID) | INTRAVENOUS | Status: DC
  Administered 2023-07-18 – 2023-07-20 (×5): 100 mg via INTRAVENOUS
  Filled 2023-07-18 (×7): qty 100

## 2023-07-18 MED ORDER — ATORVASTATIN CALCIUM 20 MG PO TABS
40.0000 mg | ORAL_TABLET | Freq: Every day | ORAL | Status: DC
  Administered 2023-07-18 – 2023-07-22 (×5): 40 mg via ORAL
  Filled 2023-07-18 (×5): qty 2

## 2023-07-18 MED ORDER — HEPARIN BOLUS VIA INFUSION
7000.0000 [IU] | Freq: Once | INTRAVENOUS | Status: AC
  Administered 2023-07-18: 7000 [IU] via INTRAVENOUS
  Filled 2023-07-18: qty 7000

## 2023-07-18 MED ORDER — ASPIRIN 81 MG PO CHEW
81.0000 mg | CHEWABLE_TABLET | Freq: Every day | ORAL | Status: DC
  Administered 2023-07-18 – 2023-07-22 (×5): 81 mg via ORAL
  Filled 2023-07-18 (×5): qty 1

## 2023-07-18 MED ORDER — ALLOPURINOL 100 MG PO TABS
200.0000 mg | ORAL_TABLET | Freq: Every day | ORAL | Status: DC
  Administered 2023-07-19 – 2023-07-25 (×7): 200 mg via ORAL
  Filled 2023-07-18 (×7): qty 2

## 2023-07-18 MED ORDER — DM-GUAIFENESIN ER 30-600 MG PO TB12
1.0000 | ORAL_TABLET | Freq: Two times a day (BID) | ORAL | Status: DC | PRN
  Administered 2023-07-23: 1 via ORAL
  Filled 2023-07-18: qty 1

## 2023-07-18 MED ORDER — SODIUM CHLORIDE 0.9 % IV SOLN
2.0000 g | Freq: Once | INTRAVENOUS | Status: AC
  Administered 2023-07-18: 2 g via INTRAVENOUS
  Filled 2023-07-18: qty 20

## 2023-07-18 MED ORDER — ACETAMINOPHEN 325 MG PO TABS
650.0000 mg | ORAL_TABLET | Freq: Four times a day (QID) | ORAL | Status: DC | PRN
Start: 2023-07-18 — End: 2023-07-25

## 2023-07-18 MED ORDER — VANCOMYCIN HCL 2000 MG/400ML IV SOLN
2000.0000 mg | Freq: Once | INTRAVENOUS | Status: AC
  Administered 2023-07-18: 2000 mg via INTRAVENOUS
  Filled 2023-07-18: qty 400

## 2023-07-18 MED ORDER — METRONIDAZOLE 500 MG/100ML IV SOLN
500.0000 mg | Freq: Once | INTRAVENOUS | Status: AC
  Administered 2023-07-18: 500 mg via INTRAVENOUS
  Filled 2023-07-18: qty 100

## 2023-07-18 MED ORDER — ALBUTEROL SULFATE (2.5 MG/3ML) 0.083% IN NEBU: 2.5000 mg | INHALATION_SOLUTION | RESPIRATORY_TRACT | Status: DC | PRN

## 2023-07-18 MED ORDER — SODIUM CHLORIDE 0.9 % IV BOLUS
500.0000 mL | Freq: Once | INTRAVENOUS | Status: AC
  Administered 2023-07-18: 500 mL via INTRAVENOUS

## 2023-07-18 MED ORDER — DIPHENHYDRAMINE HCL 50 MG/ML IJ SOLN
12.5000 mg | Freq: Three times a day (TID) | INTRAMUSCULAR | Status: DC | PRN
  Administered 2023-07-24: 12.5 mg via INTRAVENOUS
  Filled 2023-07-18: qty 1

## 2023-07-18 NOTE — ED Notes (Signed)
Pt given sandwich tray and beverage. 

## 2023-07-18 NOTE — Sepsis Progress Note (Signed)
 eLink is following this Code Sepsis.

## 2023-07-18 NOTE — ED Notes (Signed)
 CCMD called to place pt on cardiac monitor per order

## 2023-07-18 NOTE — Progress Notes (Signed)
 ED Pharmacy Antibiotic Sign Off An antibiotic consult was received from an ED provider for vancomycin per pharmacy. A chart review was completed to assess appropriateness.   The following one time order(s) were placed:  vancomycin 2000 mg IV x 1  Further antibiotic and/or antibiotic pharmacy consults should be ordered by the admitting provider if indicated.   Thank you for allowing pharmacy to be a part of this patient's care.   Lowella Bandy, [facility]  Clinical Pharmacist 07/18/23 5:55 PM

## 2023-07-18 NOTE — H&P (Addendum)
 History and Physical    Steven Jacobson WGN:[id number] DOB: 1933-12-08 DOA: 07/18/2023  Referring MD/NP/PA:   PCP: [facility]   Patient coming from:  The patient is coming from home.     Chief Complaint: Nausea, vomiting, cough  HPI: Steven Jacobson is a 89 y.o. male with medical history significant of HTN, HLD, CAD, dCHF, gout, BPH, SVT, UTI, obesity, who presents with nausea, vomiting cough.  Per his son at the bedside, patient has been feeling weak in the past several days. He has been confused.  At his normal baseline, patient recognizes family members, most of the time he is orientated to place and time per his son. Pt has been in recliner since Monday. Pt's recliner is covered in feces and urine per EMS. Per his son, patient has had several episode of nausea and vomiting, not complaining abdominal pain.  No diarrhea.  Patient has cough with little mucus production, with SOB, not complaining of chest pain. Pt was found to have oxygen desaturation to 90% on room air, which improved to 94-97% on 2 L oxygen in ED.  No respiratory distress. Not sure if patient has symptoms of UTI.  When I saw patient in ED, he is confused, knows his own name, knows that he is in hospital, but not orientated to the time.  He moves all extremities.  No facial droop or slurred speech.   Data reviewed independently and ED Course: pt was found to have WBC 29.9, lactic acid 2.3 --> 2.0, AKI with creatinine 1.67, BUN 54, GFR 49 (recent baseline creatinine 1.09 on 2//25), UA (cloudy appearance, negative leukocyte, negative bacteria, WBC 6-10), negative PCR for COVID, flu and RSV.  Soft blood pressure 97/50, heart rate 121 --> 100s, RR 26, temperature 99.7.  CT of head negative for acute intracranial abnormalities.  Patient is admitted to PCU as inpatient.  Dr. Evie Lacks of [facility] is consulted.  Chest x-ray: Underinflated rotated radiograph.   Enlarged heart with calcified tortuous aorta. Prominent central vasculature.    Focal opacity at the left lung base with tiny effusion. Infiltrate is possible. Recommend follow-up   CT of the chest/abdomen/pelvis 1. 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 Associated small bowel misty mesentery. 2. Bilateral lower lobe multifocal pneumonia. 3. Small hiatal hernia. 4. Left renal mass measuring 4.3 cm, indeterminate due to density. Correlate with prior cross-sectional imaging if available. If not available, when the patient is clinically stable and able to follow directions and hold their breath (preferably as an outpatient) further evaluation with dedicated MRI renal protocol should be considered. 5. Cholelithiasis with no CT evidence of acute cholecystitis. 6. Colonic diverticulosis with no acute diverticulitis. 7.  Prostatomegaly with suggestion of obstructive uropathy. 8. Aortic Atherosclerosis (ICD10-I70.0) -severe, including four-vessel coronary artery and aortic valve leaflet calcifications-correlate for aortic stenosis. 9. Incidental right thyroid nodule measuring 1.9 cm. Recommend non-emergent thyroid ultrasound if clinically warranted given patient age. Reference: J Am Coll Radiol. 2015 Feb;12(2): 143-50.     EKG: I have personally reviewed.  Wide-complex tachycardia, QTc 531, LAD, poor IV progression   Review of Systems: Could not be reviewed accurately due to altered mental status.  Allergy:  Allergies  Allergen Reactions   Simvastatin Other (See Comments)    Muscle pain   Niacin Other (See Comments) and Rash    Past Medical History:  Diagnosis Date   CAD (coronary artery disease)    Hypertension     Past Surgical History:  Procedure Laterality  Date   LEFT HEART CATH AND CORS/GRAFTS ANGIOGRAPHY N/A 04/13/2021   Procedure: LEFT HEART CATH AND CORS/GRAFTS ANGIOGRAPHY;  Surgeon: Yvonne Kendall, MD;  Location: [facility] INVASIVE CV LAB;  Service: Cardiovascular;  Laterality: N/A;    Social History:   reports that he has never smoked. He has never used smokeless tobacco. He reports that he does not drink alcohol and does not use drugs.  Family History: Could not be reviewed accurately due to altered mental status.  Prior to Admission medications   Medication Sig Start Date End Date Taking? Authorizing Provider  allopurinol (ZYLOPRIM) 100 MG tablet Take 200 mg by mouth daily. To lower uric acid levels and prevent gout 11/23/20   [provider]  aspirin 81 MG chewable tablet Chew 81 mg by mouth daily. 10/05/06   [provider]  atorvastatin (LIPITOR) 80 MG tablet Take 40 mg by mouth at bedtime. 11/23/20   [provider]  clopidogrel (PLAVIX) 75 MG tablet Take 75 mg by mouth daily.    [provider]  dorzolamide-timolol (COSOPT) 22.3-6.8 MG/ML ophthalmic solution Place 1 drop into both eyes 2 (two) times daily. 10/18/20   [provider]  hydrochlorothiazide (HYDRODIURIL) 25 MG tablet Take 12.5 mg by mouth daily. 11/23/20   [provider]  latanoprost (XALATAN) 0.005 % ophthalmic solution Place 1 drop into both eyes at bedtime. 10/18/20   [provider]  lisinopril (ZESTRIL) 40 MG tablet Take 40 mg by mouth daily. 11/23/20   [provider]  metoprolol tartrate (LOPRESSOR) 100 MG tablet Take 100 mg by mouth every 12 (twelve) hours. 11/23/20   [provider]  vitamin B-12 (CYANOCOBALAMIN) 1000 MCG tablet Take 1 tablet (1,000 mcg total) by mouth daily. 04/14/21   Lurene Shadow, MD    Physical Exam: Vitals:   07/18/23 1900 07/18/23 1930 07/18/23 1954 07/18/23 2053  BP: 105/62 (!) 114/50  116/61  Pulse: (!) 103 99  (!) 128  Resp: (!) 22 18  18   Temp:      TempSrc:      SpO2: 97% 98%  100%  Weight:   107.8 kg   Height:       General: Not in acute distress HEENT:       Eyes: PERRL, EOMI, no jaundice       ENT: No discharge from the ears and nose       Neck: No JVD, no bruit, no mass felt. Heme: No neck lymph node  enlargement. Cardiac: S1/S2, RRR, No murmurs, No gallops or rubs. Respiratory: No rales, wheezing, rhonchi or rubs. GI: Soft, nondistended, nontender, no organomegaly, BS present. GU: No hematuria Ext: No pitting leg edema bilaterally. 1+DP/PT pulse bilaterally. Musculoskeletal: No joint deformities, No joint redness or warmth, no limitation of ROM in spin. Skin: No rashes.  Neuro: Confused, knows his own name, knows that he is in hospital, not oriented to the time. Cranial nerves II-XII grossly intact, moves all extremities Psych: Patient is not psychotic, no suicidal or hemocidal ideation.  Labs on Admission: I have personally reviewed following labs and imaging studies  CBC: Recent Labs  Lab 07/18/23 1719  WBC 29.9*  NEUTROABS 25.4*  HGB 13.3  HCT 40.2  MCV 88.5  PLT 223   Basic Metabolic Panel: Recent Labs  Lab 07/18/23 1719  NA 133*  K 4.5  CL 101  CO2 20*  GLUCOSE 122*  BUN 54*  CREATININE 1.67*  CALCIUM 8.1*   GFR: Estimated Creatinine Clearance: 40.7 mL/min (A) (by  C-G formula based on SCr of 1.67 mg/dL (H)). Liver Function Tests: Recent Labs  Lab 07/18/23 1719  AST 44*  ALT 33  ALKPHOS 117  BILITOT 1.9*  PROT 6.8  ALBUMIN 2.1*   Recent Labs  Lab 07/18/23 1719  LIPASE 52*   No results for input(s): "AMMONIA" in the last 168 hours. Coagulation Profile: Recent Labs  Lab 07/18/23 2037  INR 1.4*   Cardiac Enzymes: Recent Labs  Lab 07/18/23 1719  CKTOTAL 49   BNP (last 3 results) No results for input(s): "PROBNP" in the last 8760 hours. HbA1C: No results for input(s): "HGBA1C" in the last 72 hours. CBG: No results for input(s): "GLUCAP" in the last 168 hours. Lipid Profile: No results for input(s): "CHOL", "HDL", "LDLCALC", "TRIG", "CHOLHDL", "LDLDIRECT" in the last 72 hours. Thyroid Function Tests: No results for input(s): "TSH", "T4TOTAL", "FREET4", "T3FREE", "THYROIDAB" in the last 72 hours. Anemia Panel: No results for input(s):  "VITAMINB12", "FOLATE", "FERRITIN", "TIBC", "IRON", "RETICCTPCT" in the last 72 hours. Urine analysis:    Component Value Date/Time   COLORURINE AMBER (A) 07/18/2023 1723   APPEARANCEUR CLOUDY (A) 07/18/2023 1723   LABSPEC 1.018 07/18/2023 1723   PHURINE 5.0 07/18/2023 1723   GLUCOSEU NEGATIVE 07/18/2023 1723   HGBUR LARGE (A) 07/18/2023 1723   BILIRUBINUR NEGATIVE 07/18/2023 1723   KETONESUR NEGATIVE 07/18/2023 1723   PROTEINUR 100 (A) 07/18/2023 1723   NITRITE NEGATIVE 07/18/2023 1723   LEUKOCYTESUR NEGATIVE 07/18/2023 1723   Sepsis Labs: @LABRCNTIP (procalcitonin:4,lacticidven:4) ) Recent Results (from the past 240 hours)  Resp panel by RT-PCR (RSV, Flu A&B, Covid) Urine, Catheterized     Status: None   Collection Time: 07/18/23  5:19 PM   Specimen: Urine, Catheterized; Nasal Swab  Result Value Ref Range Status   SARS Coronavirus 2 by RT PCR NEGATIVE NEGATIVE Final    Comment: (NOTE) SARS-CoV-2 target nucleic acids are NOT DETECTED.  The SARS-CoV-2 RNA is generally detectable in upper respiratory specimens during the acute phase of infection. The lowest concentration of SARS-CoV-2 viral copies this assay can detect is 138 copies/mL. A negative result does not preclude SARS-Cov-2 infection and should not be used as the sole basis for treatment or other patient management decisions. A negative result may occur with  improper specimen collection/handling, submission of specimen other than nasopharyngeal swab, presence of viral mutation(s) within the areas targeted by this assay, and inadequate number of viral copies(<138 copies/mL). A negative result must be combined with clinical observations, patient history, and epidemiological information. The expected result is Negative.  Fact Sheet for Patients:  BloggerCourse.com  Fact Sheet for Healthcare Providers:  SeriousBroker.it  This test is no t yet approved or cleared by  the [place] FDA and  has been authorized for detection and/or diagnosis of SARS-CoV-2 by FDA under an Emergency Use Authorization (EUA). This EUA will remain  in effect (meaning this test can be used) for the duration of the COVID-19 declaration under Section 564(b)(1) of the Act, 21 U.S.C.section 360bbb-3(b)(1), unless the authorization is terminated  or revoked sooner.       Influenza A by PCR NEGATIVE NEGATIVE Final   Influenza B by PCR NEGATIVE NEGATIVE Final    Comment: (NOTE) The Xpert Xpress SARS-CoV-2/FLU/RSV plus assay is intended as an aid in the diagnosis of influenza from Nasopharyngeal swab specimens and should not be used as a sole basis for treatment. Nasal washings and aspirates are unacceptable for Xpert Xpress SARS-CoV-2/FLU/RSV testing.  Fact Sheet for Patients: BloggerCourse.com  Fact Sheet  for Healthcare Providers: SeriousBroker.it  This test is not yet approved or cleared by the [place] and has been authorized for detection and/or diagnosis of SARS-CoV-2 by FDA under an Emergency Use Authorization (EUA). This EUA will remain in effect (meaning this test can be used) for the duration of the COVID-19 declaration under Section 564(b)(1) of the Act, 21 U.S.C. section 360bbb-3(b)(1), unless the authorization is terminated or revoked.     Resp Syncytial Virus by PCR NEGATIVE NEGATIVE Final    Comment: (NOTE) Fact Sheet for Patients: BloggerCourse.com  Fact Sheet for Healthcare Providers: SeriousBroker.it  This test is not yet approved or cleared by the [place] FDA and has been authorized for detection and/or diagnosis of SARS-CoV-2 by FDA under an Emergency Use Authorization (EUA). This EUA will remain in effect (meaning this test can be used) for the duration of the COVID-19 declaration under Section 564(b)(1) of the Act, 21  U.S.C. section 360bbb-3(b)(1), unless the authorization is terminated or revoked.  Performed at [facility], [address], [place], [place] [address]      Radiological Exams on Admission:   Assessment/Plan Principal Problem:   CAP (community acquired pneumonia) Active Problems:   Severe sepsis (HCC)   Superior mesenteric vein thrombosis (HCC)   Portal vein thrombosis   Acute metabolic encephalopathy   CAD (coronary artery disease)   Essential hypertension   Chronic diastolic CHF (congestive heart failure) (HCC)   AKI (acute kidney injury) (HCC)   Renal mass   Thyroid nodule   SVT (supraventricular tachycardia) (HCC)   Obesity (BMI 30-39.9)   Assessment and Plan:  Severe sepsis due to CAP (community acquired pneumonia): Patient has 2 L new oxygen requirement.  Patient meets criteria for severe sepsis with WBC 29.9, heart rate up to 120s, RR 26, lactic acid 2.3 --> 2.0.  Patient has altered mental status and AKI.  - Will admit to PCU as inpt - IV Rocephin and doxycycline (patient received 1 dose of vancomycin and Flagyl in ED) - Incentive spirometry - Mucinex for cough  - Bronchodilators - Urine legionella and S. pneumococcal antigen - Follow up blood culture x2, sputum culture - will trend lactic acid  - IVF: 2.5L of NS bolus in ED, followed by 75 mL per hour of NS  Superior mesenteric vein thrombosis (HCC) and portal vein thrombosis:  consulted Dr. Evie Lacks of [facility] who recommended IV heparin and supportive care. -IV heparin  Acute metabolic encephalopathy: Likely multifactorial etiology, including severe sepsis, hypoxia, worsening renal function.  CT head negative. -Frequent neurocheck -Fall precaution  CAD (coronary artery disease): -Aspirin, Lipitor  Essential hypertension -Hold blood pressure medications currently including HCTZ, metoprolol, lisinopril due to severe sepsis and soft blood pressure  Chronic diastolic CHF (congestive heart failure)  (HCC): 2D echo 04/13/2021 showed EF of 50-55% with grade 1 diastolic dysfunction.  No leg edema, CHF seem to be compensated. -Check BNP  AKI (acute kidney injury) (HCC): Likely due to dehydration and continuation of diuretics and lisinopril.  -Hold HCTZ and lisinopril -IV fluid as above  Renal mass and  thyroid nodule: This incidental findings by CT scan -Need to follow-up with PCP as outpatient  History of SVT (supraventricular tachycardia) (HCC): EKG showed a wide-complex tachycardia.  Current heart rate 100-120s. -As needed IV metoprolol 2.5 mg every 2 hour for heart rate> 125  Obesity (BMI 30-39.9) -When mental status improves, need counseling about importance of losing weight, and encourage patient to exercise and take healthy diet.  DVT ppx: on Iv Heparin     Code Status: Full code per his son  Family Communication:  Yes, patient's son   at bed side.   Disposition Plan:  Anticipate discharge back to previous environment  Consults called:   Dr. Evie Lacks of [facility] is consulted.  Admission status and Level of care: Progressive:    for obs as inpt        Dispo: The patient is from: Home              Anticipated d/c is to: Home              Anticipated d/c date is: 2 days              Patient currently is not medically stable to d/c.    Severity of Illness:  The appropriate patient status for this patient is INPATIENT. Inpatient status is judged to be reasonable and necessary in order to provide the required intensity of service to ensure the patient's safety. The patient's presenting symptoms, physical exam findings, and initial radiographic and laboratory data in the context of their chronic comorbidities is felt to place them at high risk for further clinical deterioration. Furthermore, it is not anticipated that the patient will be medically stable for discharge from the hospital within 2 midnights of admission.   * I certify that at the point of admission it is my  clinical judgment that the patient will require inpatient hospital care spanning beyond 2 midnights from the point of admission due to high intensity of service, high risk for further deterioration and high frequency of surveillance required.*       Date of Service 07/18/2023    Lorretta Harp [facility]   If 7PM-7AM, please contact night-coverage [email or web address] 07/18/2023, 10:10 PM

## 2023-07-18 NOTE — Sepsis Progress Note (Signed)
 Following for sepsis monitoring ?

## 2023-07-18 NOTE — ED Notes (Signed)
Pt to CT at this time via stretcher

## 2023-07-18 NOTE — ED Notes (Signed)
 Xr at bedside

## 2023-07-18 NOTE — Consult Note (Signed)
 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   Allergies  Allergen Reactions   Simvastatin Other (See Comments)    Muscle pain   Niacin Other (See Comments) and Rash    Patient Measurements: Height: 6\' 2"  (188 cm) Weight: 107.8 kg (237 lb 10.5 oz) IBW/kg (Calculated) : 82.2 Heparin Dosing Weight: 104.3kg  Vital Signs: Temp: 99.7 F (37.6 C) (02/28 1655) Temp Source: Oral (02/28 1655) BP: 114/50 (02/28 1930) Pulse Rate: 99 (02/28 1930)  Labs: Recent Labs    07/18/23 1719  HGB 13.3  HCT 40.2  PLT 223  CREATININE 1.67*  CKTOTAL 49    Estimated Creatinine Clearance: 40.7 mL/min (A) (by C-G formula based on SCr of 1.67 mg/dL (H)).   Medical History: Past Medical History:  Diagnosis Date   CAD (coronary artery disease)    Hypertension     Medications:  NO AC prior to admission. Received Rx from [place]  Assessment: 89 y.o. male with history of HTN, HLD presenting today for weakness. 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 Pharmacy consulted to initiate and manage heparin infusion.  Baseline aPTT and INR ordered by MD still pending. Baseline Hgb and plt appropriate to  initiate heparin therapy.   Goal of Therapy:  Heparin level 0.3-0.7 units/ml Monitor platelets by anticoagulation protocol: Yes   Plan:  Give 7000 units bolus x 1 Start heparin infusion at 1700 units/hr Check HL in 8 hours and daily while on heparin Continue to monitor H&H and platelets  Sharlett Lienemann Rodriguez-Guzman PharmD, BCPS 07/18/2023 8:05 PM

## 2023-07-18 NOTE — ED Provider Notes (Signed)
 [facility] Provider Note    Event Date/Time   First MD Initiated Contact with Patient 07/18/23 1647     (approximate)   History   Weakness (Pt presenting via EMS from home. Per EMS report, pt unable to get around like normally, has been in recliner since Monday. Pt's recliner covered in feces and urine per EMS. On arrival to room, pt covered in feces, urine, driedf secretions/vomit. Pt AAOx3 on arrival to room. Pt was 90% on RA, placed on 2L [place] which improved sats to 94%)   HPI Steven Jacobson is a 89 y.o. male with history of HTN, HLD presenting today for weakness.  Patient was reportedly last seen normal by son 4 days ago.  Today he went to check on him and was found to be in his recliner covered in feces and urine.  There appeared to be secretions around his mouth like vomit as well.  He was found to be 90% on room air and EMS placed him on 2 L.  Patient here is denying all symptoms.  When asked why he was covered in feces, urine, and vomit, he states he was unable to get up.  He did state that he could walk today but when asked why he did not walk when he had to have a bowel movement he said "that is why I called EMS".  Chart review: Patient with recent admission on 06/22/2023 for altered mental status secondary to UTI.Marland Kitchen     Physical Exam   Triage Vital Signs: ED Triage Vitals [07/18/23 1650]  Encounter Vitals Group     BP      Systolic BP Percentile      Diastolic BP Percentile      Pulse      Resp      Temp      Temp src      SpO2 94 %     Weight      Height      Head Circumference      Peak Flow      Pain Score      Pain Loc      Pain Education      Exclude from Growth Chart     Most recent vital signs: Vitals:   07/18/23 1900 07/18/23 1930  BP: 105/62 (!) 114/50  Pulse: (!) 103 99  Resp: (!) 22 18  Temp:    SpO2: 97% 98%   Physical Exam: I have reviewed the vital signs and nursing notes. General: Awake, alert, no acute distress.   Covered in feces, urine, and slight dried vomit around the face Head:  Atraumatic, normocephalic.   ENT:  EOM intact, PERRL. Oral mucosa is pink and moist with no lesions. Neck: Neck is supple with full range of motion, No meningeal signs. Cardiovascular:  tachycardia, RR, No murmurs. Peripheral pulses palpable and equal bilaterally. Respiratory:  Symmetrical chest wall expansion.  No rhonchi, rales, or wheezes.  Good air movement throughout.  No use of accessory muscles.   Musculoskeletal:  No cyanosis or edema. Moving extremities with full ROM Abdomen:  Soft, nontender, nondistended. Neuro:  GCS 14 (confusion), moving all four extremities, interacting appropriately. Speech clear. Psych:  Calm, appropriate.   Skin:  Warm, dry, no rash.    ED Results / Procedures / Treatments   Labs (all labs ordered are listed, but only abnormal results are displayed) Labs Reviewed  CBC WITH DIFFERENTIAL/PLATELET - Abnormal; Notable for the following components:  Result Value   WBC 29.9 (*)    Neutro Abs 25.4 (*)    Monocytes Absolute 1.7 (*)    Basophils Absolute 0.2 (*)    Abs Immature Granulocytes 0.87 (*)    All other components within normal limits  COMPREHENSIVE METABOLIC PANEL - Abnormal; Notable for the following components:   Sodium 133 (*)    CO2 20 (*)    Glucose, Bld 122 (*)    BUN 54 (*)    Creatinine, Ser 1.67 (*)    Calcium 8.1 (*)    Albumin 2.1 (*)    AST 44 (*)    Total Bilirubin 1.9 (*)    GFR, Estimated 39 (*)    All other components within normal limits  LACTIC ACID, PLASMA - Abnormal; Notable for the following components:   Lactic Acid, Venous 2.3 (*)    All other components within normal limits  LIPASE, BLOOD - Abnormal; Notable for the following components:   Lipase 52 (*)    All other components within normal limits  URINALYSIS, W/ REFLEX TO CULTURE (INFECTION SUSPECTED) - Abnormal; Notable for the following components:   Color, Urine AMBER (*)    APPearance  CLOUDY (*)    Hgb urine dipstick LARGE (*)    Protein, ur 100 (*)    All other components within normal limits  RESP PANEL BY RT-PCR (RSV, FLU A&B, COVID)  RVPGX2  CULTURE, BLOOD (ROUTINE X 2)  CULTURE, BLOOD (ROUTINE X 2)  CK  LACTIC ACID, PLASMA  PROTIME-INR  APTT     EKG My EKG interpretation: Rate of 149, sinus tachycardia, normal axis, normal intervals.  No acute ST elevations or depressions.  Widened QRS with accelerated right bundle branch block.  Morphology overall similar to most recent EKG on 06/22/2023   RADIOLOGY Independently interpreted chest x-ray as well as CT chest/abdomen/pelvis with evidence of multifocal lower pneumonia and nonocclusive thrombosis in the portal and superior mesenteric veins.   PROCEDURES:  Critical Care performed: Yes, see critical care procedure note(s)  .Critical Care  Performed by: Janith Lima, MD Authorized by: Janith Lima, MD   Critical care provider statement:    Critical care time (minutes):  40   Critical care was necessary to treat or prevent imminent or life-threatening deterioration of the following conditions:  Sepsis and respiratory failure   Critical care was time spent personally by me on the following activities:  Development of treatment plan with patient or surrogate, discussions with consultants, evaluation of patient's response to treatment, examination of patient, ordering and review of laboratory studies, ordering and review of radiographic studies, ordering and performing treatments and interventions, pulse oximetry, re-evaluation of patient's condition and review of old charts   I assumed direction of critical care for this patient from another provider in my specialty: no     Care discussed with: admitting provider      MEDICATIONS ORDERED IN ED: Medications  lactated ringers infusion ( Intravenous New Bag/Given 07/18/23 1943)  metroNIDAZOLE (FLAGYL) IVPB 500 mg (500 mg Intravenous New Bag/Given 07/18/23 1935)   vancomycin (VANCOREADY) IVPB 2000 mg/400 mL (has no administration in time range)  sodium chloride 0.9 % bolus 1,000 mL (1,000 mLs Intravenous New Bag/Given 07/18/23 1727)  cefTRIAXone (ROCEPHIN) 2 g in sodium chloride 0.9 % 100 mL IVPB (0 g Intravenous Stopped 07/18/23 1935)  iohexol (OMNIPAQUE) 350 MG/ML injection 80 mL (80 mLs Intravenous Contrast Given 07/18/23 1848)     IMPRESSION / MDM / ASSESSMENT AND PLAN /  ED COURSE  I reviewed the triage vital signs and the nursing notes.                              Differential diagnosis includes, but is not limited to, UTI, COVID, flu, RSV, viral gastroenteritis, CVA, electrolyte abnormality, dehydration  Patient's presentation is most consistent with acute presentation with potential threat to life or bodily function.  Patient is an 89 year old male presenting today for weakness and altered mental status and covered in feces and urine.  He does appear alert on exam but is disoriented and does not know what year it is.  He cannot give a clear reason for why he is covered in feces and urine.  Otherwise denying all pain complaints.  Initially with no hypoxia but did have tachycardia and tachypnea present.  Started on sepsis protocol and given broad-spectrum antibiotics.  Leukocytosis at 29.9.  Lactic acid elevated at 2.5.  UA negative.  Negative for COVID, flu, RSV.  CMP with slight AKI and mild elevated T. bili but otherwise nonspecific.  CT abdomen/pelvis showing nonocclusive thrombi in the portal and superior mesenteric veins with some mild stranding.  Patient nontender on his abdomen.  Discussed with vascular who recommends initiation of heparin.  Separately, patient with multifocal pneumonia seen on CT is likely source of his sepsis.  Patient given additional liter of fluids and will be admitted to hospitalist for further care given that he is on 2 L at this time.  The patient is on the cardiac monitor to evaluate for evidence of arrhythmia and/or  significant heart rate changes. Clinical Course as of 07/18/23 2004  Fri Jul 18, 2023  1746 No UTI.  Will expand antibiotic coverage to include vancomycin and Flagyl [DW]  1918 Radiology - portal and superior mesenteric vein thrombosis. Non-occlusive with mesenteric stranding. [DW]  1919 CT CHEST ABDOMEN PELVIS W CONTRAST 1. 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 Associated small bowel misty mesentery. 2. Bilateral lower lobe multifocal pneumonia. [DW]  1931 Vascular - heparanize, continue to follow [DW]    Clinical Course User Index [DW] Janith Lima, MD     FINAL CLINICAL IMPRESSION(S) / ED DIAGNOSES   Final diagnoses:  Multifocal pneumonia  Sepsis, due to unspecified organism, unspecified whether acute organ dysfunction present [facility])  Superior mesenteric vein thrombosis (HCC)  Portal vein thrombosis     Rx / DC Orders   ED Discharge Orders     None        Note:  This document was prepared using Dragon voice recognition software and may include unintentional dictation errors.   Janith Lima, MD 07/18/23 2007

## 2023-07-19 DIAGNOSIS — J189 Pneumonia, unspecified organism: Secondary | ICD-10-CM

## 2023-07-19 DIAGNOSIS — A419 Sepsis, unspecified organism: Secondary | ICD-10-CM

## 2023-07-19 LAB — CBC
HCT: 37.6 % — ABNORMAL LOW (ref 39.0–52.0)
Hemoglobin: 12.6 g/dL — ABNORMAL LOW (ref 13.0–17.0)
MCH: 29.7 pg (ref 26.0–34.0)
MCHC: 33.5 g/dL (ref 30.0–36.0)
MCV: 88.7 fL (ref 80.0–100.0)
Platelets: 182 10*3/uL (ref 150–400)
RBC: 4.24 MIL/uL (ref 4.22–5.81)
RDW: 15.5 % (ref 11.5–15.5)
WBC: 25.1 10*3/uL — ABNORMAL HIGH (ref 4.0–10.5)
nRBC: 0 % (ref 0.0–0.2)

## 2023-07-19 LAB — COMPREHENSIVE METABOLIC PANEL
ALT: 26 U/L (ref 0–44)
AST: 42 U/L — ABNORMAL HIGH (ref 15–41)
Albumin: 1.8 g/dL — ABNORMAL LOW (ref 3.5–5.0)
Alkaline Phosphatase: 108 U/L (ref 38–126)
Anion gap: 11 (ref 5–15)
BUN: 44 mg/dL — ABNORMAL HIGH (ref 8–23)
CO2: 17 mmol/L — ABNORMAL LOW (ref 22–32)
Calcium: 7.3 mg/dL — ABNORMAL LOW (ref 8.9–10.3)
Chloride: 106 mmol/L (ref 98–111)
Creatinine, Ser: 1.26 mg/dL — ABNORMAL HIGH (ref 0.61–1.24)
GFR, Estimated: 55 mL/min — ABNORMAL LOW (ref >60)
Glucose, Bld: 120 mg/dL — ABNORMAL HIGH (ref 70–99)
Potassium: 4.1 mmol/L (ref 3.5–5.1)
Sodium: 134 mmol/L — ABNORMAL LOW (ref 135–145)
Total Bilirubin: 1.4 mg/dL — ABNORMAL HIGH (ref 0.0–1.2)
Total Protein: 5.7 g/dL — ABNORMAL LOW (ref 6.5–8.1)

## 2023-07-19 LAB — EXPECTORATED SPUTUM ASSESSMENT W GRAM STAIN, RFLX TO RESP C

## 2023-07-19 LAB — HEPARIN LEVEL (UNFRACTIONATED)
Heparin Unfractionated: 0.13 [IU]/mL — ABNORMAL LOW (ref 0.30–0.70)
Heparin Unfractionated: 0.12 [IU]/mL — ABNORMAL LOW (ref 0.30–0.70)

## 2023-07-19 LAB — AMMONIA: Ammonia: 34 umol/L (ref 9–35)

## 2023-07-19 LAB — STREP PNEUMONIAE URINARY ANTIGEN: Strep Pneumo Urinary Antigen: NEGATIVE

## 2023-07-19 MED ORDER — HEPARIN BOLUS VIA INFUSION
3100.0000 [IU] | Freq: Once | INTRAVENOUS | Status: AC
  Administered 2023-07-19: 3100 [IU] via INTRAVENOUS
  Filled 2023-07-19: qty 3100

## 2023-07-19 NOTE — Consult Note (Signed)
 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   Allergies  Allergen Reactions   Simvastatin Other (See Comments)    Muscle pain   Niacin Other (See Comments) and Rash    Patient Measurements: Height: 6\' 2"  (188 cm) Weight: 107.8 kg (237 lb 10.5 oz) IBW/kg (Calculated) : 82.2 Heparin Dosing Weight: 104.3kg  Vital Signs: Temp: 99.7 F (37.6 C) (02/28 1655) Temp Source: Oral (02/28 1655) BP: 114/73 (03/01 0300) Pulse Rate: 71 (03/01 0300)  Labs: Recent Labs    07/18/23 1719 07/18/23 2037 07/19/23 0308  HGB 13.3  --  12.6*  HCT 40.2  --  37.6*  PLT 223  --  182  APTT  --  28  --   LABPROT  --  17.5*  --   INR  --  1.4*  --   HEPARINUNFRC  --   --  0.13*  CREATININE 1.67*  --   --   CKTOTAL 49  --   --     Estimated Creatinine Clearance: 40.7 mL/min (A) (by C-G formula based on SCr of 1.67 mg/dL (H)).   Medical History: Past Medical History:  Diagnosis Date   CAD (coronary artery disease)    Hypertension     Medications:  NO AC prior to admission. Received Rx from [place]  Assessment: 89 y.o. male with history of HTN, HLD presenting today for weakness. 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 Pharmacy consulted to initiate and manage heparin infusion.  Baseline aPTT and INR ordered by MD still pending. Baseline Hgb and plt appropriate to  initiate heparin therapy.   Goal of Therapy:  Heparin level 0.3-0.7 units/ml Monitor platelets by anticoagulation protocol: Yes   Plan:  3/1: HL @ 0308 = 0.13, SUBtherapeutic - Will order heparin 3100 units IV X 1 bolus and increase drip rate to 2100 units/hr - Will recheck HL 8 hrs after rate change  07/19/2023 3:57 AM

## 2023-07-19 NOTE — ED Notes (Signed)
 Pt had a large tarry looking loose BM as well urinating and having blood in his urine. The blood in the urine may have been from trauma caused by an in and out cath earlier the previous day.

## 2023-07-19 NOTE — Plan of Care (Signed)
  Problem: Clinical Measurements: Goal: Ability to maintain clinical measurements within normal limits will improve Outcome: Progressing Goal: Respiratory complications will improve Outcome: Progressing Goal: Cardiovascular complication will be avoided Outcome: Progressing   Problem: Activity: Goal: Risk for activity intolerance will decrease Outcome: Progressing   Problem: Nutrition: Goal: Adequate nutrition will be maintained Outcome: Progressing   

## 2023-07-19 NOTE — ED Notes (Signed)
 Patient cleaned up, new brief placed, warm blankets placed at this time. Bed low and locked, call bell in reach.

## 2023-07-19 NOTE — Progress Notes (Signed)
 Progress Note   Patient: Steven Jacobson YNW:[id number] DOB: 08-14-1933 DOA: 07/18/2023     1 DOS: the patient was seen and examined on 07/19/2023   Brief hospital course: 89yo with h/o HTN, HLD, CAD, chronic diastolic CHF, gout, and class 1 obesity who presented on 2/28 with n/v and cough and was found to have sepsis due to multifocal PNA as well as an extensive nonocclusive thrombus of the portal and superior mesenteric veins and a L renal mass.  He was started on Ceftriaxone and doxycycline with improvement in sepsis physiology.  He was also started on heparin infusion with vascular surgery consultation.  Assessment and Plan:  Severe sepsis due to CAP Patient has 2 L new oxygen requirement Patient met criteria for severe sepsis with WBC 29.9, heart rate up to 120s, RR 26, lactic acid 2.3 --> 2.0; also with altered mental status and AKI. Admitted to progressive care -> telemetry IV Rocephin and doxycycline (patient received 1 dose of vancomycin and Flagyl in ED) Incentive spirometry Mucinex for cough  Bronchodilators Urine legionella and S. pneumococcal antigen Follow up blood culture x2, sputum culture   Superior mesenteric vein thrombosis/portal vein thrombosis Consulted Dr. Evie Lacks of [facility] who recommended IV heparin and supportive care, serial exams; vascular surgery signed off Continue heparin Denies prior h/o VTE  With renal mass and large thyroid nodule, possible hypercoagulable associated with underlying malignancy? Will reach to oncology re: inpatient vs. Short-term outpatient f/u - Dr. Alena Bills will arrange for outpatient cancer center appointment   Acute metabolic encephalopathy Likely multifactorial etiology, including severe sepsis, hypoxia, worsening renal function CT head negative Fall precautions   CAD (coronary artery disease) Continue Aspirin, Lipitor   Essential hypertension Hold blood pressure medications currently including HCTZ, metoprolol, lisinopril due to severe  sepsis and soft blood pressure BPs 93/52-126/66   Chronic diastolic CHF (congestive heart failure) 2D echo 04/13/2021 showed EF of 50-55% with grade 1 diastolic dysfunction CHF seems to be compensated   AKI (acute kidney injury) Likely due to dehydration and continuation of diuretics and lisinopril in the setting of severe sepsis Hold HCTZ and lisinopril IV fluid  Improving   Renal mass and  thyroid nodule Incidental findings by CT scan Need to follow-up with PCP as outpatient   History of SVT (supraventricular tachycardia)  EKG showed a wide-complex tachycardia As needed IV metoprolol 2.5 mg every 2 hour for heart rate> 125 HR currently 94   Obesity (BMI 30-39.9) Body mass index is 30.51 kg/m.Marland Kitchen  Weight loss should be encouraged Outpatient PCP/bariatric medicine f/u encouraged   DNR Discussed with patient  He prefers to "die naturally" should that situation arise Code status changed     Consultants: Vascular surgery RT  Procedures: None  Antibiotics: Ceftriaxone 2/28- Doxycycline 2/28- Vancomycin x 1 Metronidazole x 1  30 Day Unplanned Readmission Risk Score    Flowsheet Row ED to Hosp-Admission (Current) from 07/18/2023 in [facility] Emergency [facility]  30 Day Unplanned Readmission Risk Score (%) 18.79 Filed at 07/19/2023 0800       This score is the patient's risk of an unplanned readmission within 30 days of being discharged (0 -100%). The score is based on dignosis, age, lab data, medications, orders, and past utilization.   Low:  0-14.9   Medium: 15-21.9   High: 22-29.9   Extreme: 30 and above          Subjective: Feeling better.  Breathing is improved with [place] O2.  No concerns.  Reports that  he wants to be DNR.  Physical Exam: Vitals:   07/19/23 0529 07/19/23 0530 07/19/23 0600 07/19/23 0630  BP:  (!) 93/52 (!) 110/50 93/65  Pulse:  84 94 95  Resp:  (!) 22 (!) 22 (!) 22  Temp: 98.1 F (36.7 C)     TempSrc: Oral      SpO2:  100% 95% 100%  Weight:      Height:        Intake/Output Summary (Last 24 hours) at 07/19/2023 0849 Last data filed at 07/19/2023 0235 Gross per 24 hour  Intake 1050.59 ml  Output --  Net 1050.59 ml   Filed Weights   07/18/23 1954  Weight: 107.8 kg    Exam:  General:  Appears calm and comfortable and is in NAD, on [place] O2 Eyes:  EOMI, normal lids, iris ENT:  grossly normal hearing, lips & tongue, mmm Cardiovascular:  RRR, no m/r/g. No LE edema.  Respiratory:   Bibasilar R > L rhonchi.  Mildly increased respiratory effort.  On [place] O2 Abdomen:  soft, NT, ND Skin:  no rash or induration seen on limited exam Musculoskeletal:  grossly normal tone BUE/BLE, good ROM, no bony abnormality Psychiatric:  blunted mood and affect, speech fluent and appropriate, AOx3 Neurologic:  CN 2-12 grossly intact, moves all extremities in coordinated fashion    Data Reviewed: I have reviewed the patient's lab results since admission.  Pertinent labs for today include:  Na++ 134, up from 133 CO2 17, down from 20 Glucose 120 BUN 44/Creatinine 1.36/GFR 55, improved from 54/1.67/39; normal baseline Albumin 1.8 Bili 1.4 Lactate 2.3, 2.0 WBC 25.1, down from 29.9 Hgb 12.6 COVID/flu/RSV negative UA: large Hgb, 100 protein Blood cultures pending Urine culture with MDR E coli on 06/22/23 (sensitive to Cefepime, Ceftriaxone, Cipro, Imipenem, and Nitrofurantoin)    Family Communication: None present; I attempted to reach his son by telephone without success  Disposition: Status is: Inpatient Remains inpatient appropriate because: ongoing management  Planned Discharge Destination:  TBD    Time spent: 50 minutes  Author: Jonah Blue, MD 07/19/2023 8:46 AM  For on call review [email or web address].

## 2023-07-19 NOTE — Consult Note (Signed)
 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   Allergies  Allergen Reactions   Simvastatin Other (See Comments)    Muscle pain   Niacin Other (See Comments) and Rash    Patient Measurements: Height: 6\' 2"  (188 cm) Weight: 107.8 kg (237 lb 10.5 oz) IBW/kg (Calculated) : 82.2 Heparin Dosing Weight: 104.3kg  Vital Signs: Temp: 97.6 F (36.4 C) (03/01 1000) Temp Source: Oral (03/01 1000) BP: 112/58 (03/01 1000) Pulse Rate: 94 (03/01 1000)  Labs: Recent Labs    07/18/23 1719 07/18/23 2037 07/19/23 0308 07/19/23 1432  HGB 13.3  --  12.6*  --   HCT 40.2  --  37.6*  --   PLT 223  --  182  --   APTT  --  28  --   --   LABPROT  --  17.5*  --   --   INR  --  1.4*  --   --   HEPARINUNFRC  --   --  0.13* 0.12*  CREATININE 1.67*  --  1.26*  --   CKTOTAL 49  --   --   --     Estimated Creatinine Clearance: 54 mL/min (A) (by C-G formula based on SCr of 1.26 mg/dL (H)).   Medical History: Past Medical History:  Diagnosis Date   CAD (coronary artery disease)    Hypertension     Medications:  NO AC prior to admission. Received Rx from [place]  Assessment: 89 y.o. male with history of HTN, HLD presenting today for weakness. 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 Pharmacy consulted to initiate and manage heparin infusion.  Baseline aPTT and INR ordered by MD still pending. Baseline Hgb and plt appropriate to  initiate heparin therapy.   Date Time HL Rate/Comment  3/01 1432 0.12 Subtherapeutic on 2100 u/hr  Goal of Therapy:  Heparin level 0.3-0.7 units/ml Monitor platelets by anticoagulation protocol: Yes   Plan:  Give heparin bolus of 3100 units x1 Increase heparin infusion to 2400 units/hour Check heparin level in 8 hours Monitor CBC and signs/symptoms of bleeding  Thank you for involving pharmacy in this patient's care.   Rockwell Alexandria,  PharmD Clinical Pharmacist 07/19/2023 3:53 PM

## 2023-07-19 NOTE — Consult Note (Signed)
 [facility] Vascular Consult Note  MRN : [id number]  Steven Jacobson is a 89 y.o. ([birth year]) male who presents with chief complaint of  Chief Complaint  Patient presents with   Weakness    Pt presenting via EMS from home. Per EMS report, pt unable to get around like normally, has been in recliner since Monday. Pt's recliner covered in feces and urine per EMS. On arrival to room, pt covered in feces, urine, driedf secretions/vomit. Pt AAOx3 on arrival to room. Pt was 90% on RA, placed on 2L [place] which improved sats to 94%  .  History of Present Illness: 24 yom h/o HTN, CAD- h/o CABG on ASA/Plavix, presented to ER with several days of confusion, nausea, vomiting and productive cough. Found to be septic with pneumonia; UTI. 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 Asked to evaluate for such. Patient confused upon exam but denies abdominal pain or nausea.  Current Facility-Administered Medications  Medication Dose Route Frequency Provider Last Rate Last Admin   acetaminophen (TYLENOL) tablet 650 mg  650 mg Oral Q6H PRN Lorretta Harp, MD       albuterol (PROVENTIL) (2.5 MG/3ML) 0.083% nebulizer solution 2.5 mg  2.5 mg Nebulization Q4H PRN Lorretta Harp, MD       allopurinol (ZYLOPRIM) tablet 200 mg  200 mg Oral Daily Lorretta Harp, MD       aspirin chewable tablet 81 mg  81 mg Oral Daily Lorretta Harp, MD   81 mg at 07/18/23 2325   atorvastatin (LIPITOR) tablet 40 mg  40 mg Oral QHS Lorretta Harp, MD   40 mg at 07/18/23 2325   cefTRIAXone (ROCEPHIN) 2 g in sodium chloride 0.9 % 100 mL IVPB  2 g Intravenous Q24H Lorretta Harp, MD       dextromethorphan-guaiFENesin (MUCINEX DM) 30-600 MG per 12 hr tablet 1 tablet  1 tablet Oral BID PRN Lorretta Harp, MD       diphenhydrAMINE (BENADRYL) injection 12.5 mg  12.5 mg Intravenous Q8H PRN Lorretta Harp, MD       doxycycline (VIBRAMYCIN) 100 mg in sodium chloride 0.9 % 250 mL IVPB  100 mg  Intravenous Q12H Lorretta Harp, MD   Stopped at 07/19/23 0129   heparin ADULT infusion 100 units/mL (25000 units/268mL)  2,100 Units/hr Intravenous Continuous Lorretta Harp, MD 21 mL/hr at 07/19/23 0603 2,100 Units/hr at 07/19/23 0603   lactated ringers infusion   Intravenous Continuous Lorretta Harp, MD 75 mL/hr at 07/19/23 0523 New Bag at 07/19/23 0523   latanoprost (XALATAN) 0.005 % ophthalmic solution 1 drop  1 drop Both Eyes QHS Lorretta Harp, MD       metoprolol tartrate (LOPRESSOR) injection 2.5 mg  2.5 mg Intravenous Q2H PRN Lorretta Harp, MD   2.5 mg at 07/19/23 4098   oxyCODONE (Oxy IR/ROXICODONE) immediate release tablet 5 mg  5 mg Oral Q6H PRN Lorretta Harp, MD       Current Outpatient Medications  Medication Sig Dispense Refill   allopurinol (ZYLOPRIM) 100 MG tablet Take 200 mg by mouth daily. To lower uric acid levels and prevent gout     atorvastatin (LIPITOR) 80 MG tablet Take 40 mg by mouth at bedtime.     clopidogrel (PLAVIX) 75 MG tablet Take 75 mg by mouth daily.     latanoprost (XALATAN) 0.005 % ophthalmic solution Place 1 drop into both eyes at bedtime.     lisinopril (ZESTRIL) 40 MG tablet Take 40 mg  by mouth daily.     metoprolol tartrate (LOPRESSOR) 100 MG tablet Take 100 mg by mouth every 12 (twelve) hours.     aspirin 81 MG chewable tablet Chew 81 mg by mouth daily. (Patient not taking: Reported on 07/18/2023)      Past Medical History:  Diagnosis Date   CAD (coronary artery disease)    Hypertension     Past Surgical History:  Procedure Laterality Date   LEFT HEART CATH AND CORS/GRAFTS ANGIOGRAPHY N/A 04/13/2021   Procedure: LEFT HEART CATH AND CORS/GRAFTS ANGIOGRAPHY;  Surgeon: Yvonne Kendall, MD;  Location: [facility] INVASIVE CV LAB;  Service: Cardiovascular;  Laterality: N/A;    Social History Social History   Tobacco Use   Smoking status: Never   Smokeless tobacco: Never  Vaping Use   Vaping status: Never Used  Substance Use Topics   Alcohol use: Never   Drug use:  Never    Family History History reviewed. No pertinent family history.  Allergies  Allergen Reactions   Simvastatin Other (See Comments)    Muscle pain   Niacin Other (See Comments) and Rash     REVIEW OF SYSTEMS (Negative unless checked)  Constitutional: [] Weight loss  [] Fever  [] Chills Cardiac: [] Chest pain   [] Chest pressure   [] Palpitations   [] Shortness of breath when laying flat   [] Shortness of breath at rest   [] Shortness of breath with exertion. Vascular:  [] Pain in legs with walking   [] Pain in legs at rest   [] Pain in legs when laying flat   [] Claudication   [] Pain in feet when walking  [] Pain in feet at rest  [] Pain in feet when laying flat   [] History of DVT   [] Phlebitis   [] Swelling in legs   [] Varicose veins   [] Non-healing ulcers Pulmonary:   [] Uses home oxygen   [x] Productive cough   [] Hemoptysis   [] Wheeze  [] COPD   [] Asthma Neurologic:  [] Dizziness  [] Blackouts   [] Seizures   [] History of stroke   [] History of TIA  [] Aphasia   [] Temporary blindness   [] Dysphagia   [] Weakness or numbness in arms   [] Weakness or numbness in legs Musculoskeletal:  [] Arthritis   [] Joint swelling   [] Joint pain   [] Low back pain Hematologic:  [] Easy bruising  [] Easy bleeding   [] Hypercoagulable state   [] Anemic  [] Hepatitis Gastrointestinal:  [] Blood in stool   [] Vomiting blood  [] Gastroesophageal reflux/heartburn   [] Difficulty swallowing. Genitourinary:  [] Chronic kidney disease   [] Difficult urination  [] Frequent urination  [] Burning with urination   [] Blood in urine Skin:  [] Rashes   [] Ulcers   [] Wounds Psychological:  [] History of anxiety   []  History of major depression.  Physical Examination  Vitals:   07/19/23 0529 07/19/23 0530 07/19/23 0600 07/19/23 0630  BP:  (!) 93/52 (!) 110/50 93/65  Pulse:  84 94 95  Resp:  (!) 22 (!) 22 (!) 22  Temp: 98.1 F (36.7 C)     TempSrc: Oral     SpO2:  100% 95% 100%  Weight:      Height:       Body mass index is 30.51 kg/m. Gen:   WD/WN, NAD, some confusion Neck: Trachea midline.  No JVD.  Pulmonary:  Good air movement, respirations not labored, equal bilaterally.  Cardiac: RRR, normal S1, S2. Vascular: Palpable radial, femoral pulses, legs/feet warm  Gastrointestinal: soft, non-tender/non-distended. No guarding/reflex.  Musculoskeletal: legs/feet warm; Extremities without ischemic changes.  No deformity or atrophy. No edema. Dermatologic: No rashes or  ulcers noted.  No cellulitis or open wounds. Lymph : No Cervical, Axillary, or Inguinal lymphadenopathy.     CBC Lab Results  Component Value Date   WBC 25.1 (H) 07/19/2023   HGB 12.6 (L) 07/19/2023   HCT 37.6 (L) 07/19/2023   MCV 88.7 07/19/2023   PLT 182 07/19/2023    BMET    Component Value Date/Time   NA 134 (L) 07/19/2023 0308   K 4.1 07/19/2023 0308   CL 106 07/19/2023 0308   CO2 17 (L) 07/19/2023 0308   GLUCOSE 120 (H) 07/19/2023 0308   BUN 44 (H) 07/19/2023 0308   CREATININE 1.26 (H) 07/19/2023 0308   CALCIUM 7.3 (L) 07/19/2023 0308   GFRNONAA 55 (L) 07/19/2023 0308   Estimated Creatinine Clearance: 54 mL/min (A) (by C-G formula based on SCr of 1.26 mg/dL (H)).  COAG Lab Results  Component Value Date   INR 1.4 (H) 07/18/2023   INR 1.1 04/12/2021    Radiology CT CHEST ABDOMEN PELVIS W CONTRAST Addendum Date: 07/18/2023 ADDENDUM REPORT: 07/18/2023 19:19 ADDENDUM: These results were called by telephone at the time of interpretation on 07/18/2023 at 7:19 pm to provider DAVID WELLS , who verbally acknowledged these results. Electronically Signed   By: Tish Frederickson M.D.   On: 07/18/2023 19:19   Result Date: 07/18/2023 CLINICAL DATA:  Altered mental status, septic, UA negative. Nausea vomiting and diarrhea. Questionable pneumonia on chest x-ray and further imaging to rule out intrathoracic or intra-abdominal infection source EXAM: CT CHEST, ABDOMEN, AND PELVIS WITH CONTRAST TECHNIQUE: Multidetector CT imaging of the chest, abdomen and pelvis  was performed following the standard protocol during bolus administration of intravenous contrast. RADIATION DOSE REDUCTION: This exam was performed according to the departmental dose-optimization program which includes automated exposure control, adjustment of the mA and/or kV according to patient size and/or use of iterative reconstruction technique. CONTRAST:  80mL OMNIPAQUE IOHEXOL 350 MG/ML SOLN COMPARISON:  None Available. FINDINGS: CT CHEST FINDINGS Cardiovascular: Normal heart size. No significant pericardial effusion. The thoracic aorta is normal in caliber. Severe atherosclerotic plaque of the thoracic aorta. Four-vessel coronary artery calcifications. Aortic valve leaflet calcification. Mediastinum/Nodes: No enlarged mediastinal, hilar, or axillary lymph nodes. Trachea esophagus demonstrate no significant findings. 1.9 cm right thyroid gland hypodense nodule. Small hiatal hernia. Lungs/Pleura: Bilateral lower lobes patchy heterogeneous nonenhancing airspace opacities. Left lower lobe peribronchovascular patchy airspace opacities. Bronchial wall thickening. No pulmonary nodule. No pulmonary mass. No pleural effusion. No pneumothorax. Musculoskeletal: No chest wall abnormality.  Sternotomy wires are intact. No suspicious lytic or blastic osseous lesions. No acute displaced fracture. CT ABDOMEN PELVIS FINDINGS Hepatobiliary: No focal liver abnormality. Calcified gallstone noted within the gallbladder lumen. No gallbladder wall thickening or pericholecystic fluid. No biliary dilatation. Pancreas: No focal lesion. Normal pancreatic contour. No surrounding inflammatory changes. No main pancreatic ductal dilatation. Spleen: Normal in size without focal abnormality. Adrenals/Urinary Tract: No adrenal nodule bilaterally. Bilateral kidneys enhance symmetrically. There is a 4.3 cm left renal homogeneously hypodense lesion with a density of 41 Hounsfield units. No hydronephrosis. No hydroureter. The urinary bladder  demonstrates multiple diverticula. Stomach/Bowel: Stomach is within normal limits. No evidence of bowel wall thickening or dilatation. Colonic diverticulosis. Appendix appears normal. 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 No abdominal aorta or iliac aneurysm. Severe atherosclerotic plaque of the aorta and its branches. No abdominal, pelvic, or inguinal lymphadenopathy. Reproductive: Prostatomegaly with prostate measuring up to 7.3 cm. Other: Small bowel mesentery fat stranding. No intraperitoneal free fluid. No  intraperitoneal free gas. No organized fluid collection. Musculoskeletal: No abdominal wall hernia or abnormality. No suspicious lytic or blastic osseous lesions. No acute displaced fracture. Bilateral at least mild hip degenerative changes. Posterior disc osteophyte complex formation at the L4-L5 level. Disc bulge at the L3-L4 L4-L5 levels. IMPRESSION: 1. 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 Associated small bowel misty mesentery. 2. Bilateral lower lobe multifocal pneumonia. 3. Small hiatal hernia. 4. Left renal mass measuring 4.3 cm, indeterminate due to density. Correlate with prior cross-sectional imaging if available. If not available, when the patient is clinically stable and able to follow directions and hold their breath (preferably as an outpatient) further evaluation with dedicated MRI renal protocol should be considered. 5. Cholelithiasis with no CT evidence of acute cholecystitis. 6. Colonic diverticulosis with no acute diverticulitis. 7.  Prostatomegaly with suggestion of obstructive uropathy. 8. Aortic Atherosclerosis (ICD10-I70.0) -severe, including four-vessel coronary artery and aortic valve leaflet calcifications-correlate for aortic stenosis. 9. Incidental right thyroid nodule measuring 1.9 cm. Recommend non-emergent thyroid ultrasound if clinically warranted given patient age. Reference: J  Am Coll Radiol. 2015 Feb;12(2): 143-50. Electronically Signed: By: Tish Frederickson M.D. On: 07/18/2023 19:16   CT Head Wo Contrast Result Date: 07/18/2023 CLINICAL DATA:  Altered mental status. EXAM: CT HEAD WITHOUT CONTRAST TECHNIQUE: Contiguous axial images were obtained from the base of the skull through the vertex without intravenous contrast. RADIATION DOSE REDUCTION: This exam was performed according to the departmental dose-optimization program which includes automated exposure control, adjustment of the mA and/or kV according to patient size and/or use of iterative reconstruction technique. COMPARISON:  Head CT dated 06/22/2023. FINDINGS: Brain: Moderate age-related atrophy and chronic microvascular ischemic changes. There is no acute intracranial hemorrhage. No mass effect or midline shift. No extra-axial fluid collection. Vascular: No hyperdense vessel or unexpected calcification. Skull: Normal. Negative for fracture or focal lesion. Sinuses/Orbits: Mild mucoperiosteal thickening of paranasal sinuses. No air-fluid level. The mastoid air cells are clear Other: None IMPRESSION: 1. No acute intracranial pathology. 2. Moderate age-related atrophy and chronic microvascular ischemic changes. Electronically Signed   By: Elgie Collard M.D.   On: 07/18/2023 18:14   DG Chest Portable 1 View Result Date: 07/18/2023 CLINICAL DATA:  Shortness of breath. Hypoxia. Altered mental status EXAM: PORTABLE CHEST 1 VIEW COMPARISON:  X-ray 06/22/2023 FINDINGS: Status post median sternotomy. Enlarged cardiopericardial silhouette calcified tortuous aorta. Prominent central vasculature. Film is rotated to left. Film is underinflation. There is opacity at the left lung base. Possible infiltrate. Tiny left effusion is possible. No pneumothorax or edema. IMPRESSION: Underinflated rotated radiograph. Enlarged heart with calcified tortuous aorta. Prominent central vasculature. Focal opacity at the left lung base with tiny  effusion. Infiltrate is possible. Recommend follow-up Electronically Signed   By: Karen Kays M.D.   On: 07/18/2023 18:07   CT Head Wo Contrast Result Date: 06/22/2023 CLINICAL DATA:  89 year old male with history of altered mental status. Found down on the floor. EXAM: CT HEAD WITHOUT CONTRAST TECHNIQUE: Contiguous axial images were obtained from the base of the skull through the vertex without intravenous contrast. RADIATION DOSE REDUCTION: This exam was performed according to the departmental dose-optimization program which includes automated exposure control, adjustment of the mA and/or kV according to patient size and/or use of iterative reconstruction technique. COMPARISON:  Head CT 04/13/2021. FINDINGS: Brain: Moderate cerebral and mild cerebellar atrophy. Patchy and confluent areas of decreased attenuation are noted throughout the deep and periventricular white matter of the cerebral hemispheres bilaterally, compatible with  chronic microvascular ischemic disease. No evidence of acute infarction, hemorrhage, hydrocephalus, extra-axial collection or mass lesion/mass effect. Vascular: No hyperdense vessel or unexpected calcification. Skull: Normal. Negative for fracture or focal lesion. Sinuses/Orbits: No acute finding. Mucosal retention cyst or polyp in the right maxillary sinus. Other: None. IMPRESSION: 1. No acute intracranial abnormalities. 2. Moderate cerebral and mild cerebellar atrophy with extensive chronic microvascular ischemic changes in the cerebral white matter, as above. Electronically Signed   By: Trudie Reed M.D.   On: 06/22/2023 11:01   DG Chest Portable 1 View Result Date: 06/22/2023 CLINICAL DATA:  Altered mental status EXAM: PORTABLE CHEST 1 VIEW COMPARISON:  04/12/2021 FINDINGS: Cardiomegaly status post sternotomy and CABG. Aortic atherosclerosis. No focal airspace consolidation, pleural effusion, or pneumothorax. IMPRESSION: Cardiomegaly. No acute cardiopulmonary findings.  Electronically Signed   By: Duanne Guess D.O.   On: 06/22/2023 10:47      Assessment/Plan 1. Superior mesenteric Vein; Portal vein thrombosis Recommend:  Anticoagulation(Heparin gtt) and supportive care; serial exams No Vascular Surgery operative intervention at this juncture; if evidence of bowel infarct or hemodynamic instability not attributable to current sepsis/CAP please reconsult.    Bertram Denver, MD  07/19/2023 9:27 AM    This note was created with Dragon medical transcription system.  Any error is purely unintentional

## 2023-07-19 NOTE — ED Notes (Signed)
 Pt is passing small clots with urination.

## 2023-07-20 DIAGNOSIS — J189 Pneumonia, unspecified organism: Secondary | ICD-10-CM | POA: Diagnosis not present

## 2023-07-20 DIAGNOSIS — A419 Sepsis, unspecified organism: Secondary | ICD-10-CM | POA: Diagnosis not present

## 2023-07-20 LAB — GASTROINTESTINAL PANEL BY PCR, STOOL (REPLACES STOOL CULTURE)

## 2023-07-20 LAB — CBC WITH DIFFERENTIAL/PLATELET
Abs Immature Granulocytes: 1.01 10*3/uL — ABNORMAL HIGH (ref 0.00–0.07)
Basophils Absolute: 0.1 10*3/uL (ref 0.0–0.1)
Basophils Relative: 1 %
Eosinophils Absolute: 0.3 10*3/uL (ref 0.0–0.5)
Eosinophils Relative: 1 %
HCT: 30.9 % — ABNORMAL LOW (ref 39.0–52.0)
Hemoglobin: 10.2 g/dL — ABNORMAL LOW (ref 13.0–17.0)
Immature Granulocytes: 4 %
Lymphocytes Relative: 10 %
Lymphs Abs: 2.9 10*3/uL (ref 0.7–4.0)
MCH: 29.6 pg (ref 26.0–34.0)
MCHC: 33 g/dL (ref 30.0–36.0)
MCV: 89.6 fL (ref 80.0–100.0)
Monocytes Absolute: 2 10*3/uL — ABNORMAL HIGH (ref 0.1–1.0)
Monocytes Relative: 7 %
Neutro Abs: 21.1 10*3/uL — ABNORMAL HIGH (ref 1.7–7.7)
Neutrophils Relative %: 77 %
Platelets: 212 10*3/uL (ref 150–400)
RBC: 3.45 MIL/uL — ABNORMAL LOW (ref 4.22–5.81)
RDW: 15.7 % — ABNORMAL HIGH (ref 11.5–15.5)
Smear Review: NORMAL
WBC: 27.3 10*3/uL — ABNORMAL HIGH (ref 4.0–10.5)
nRBC: 0 % (ref 0.0–0.2)

## 2023-07-20 LAB — BASIC METABOLIC PANEL
Anion gap: 7 (ref 5–15)
BUN: 45 mg/dL — ABNORMAL HIGH (ref 8–23)
CO2: 20 mmol/L — ABNORMAL LOW (ref 22–32)
Calcium: 7.5 mg/dL — ABNORMAL LOW (ref 8.9–10.3)
Chloride: 108 mmol/L (ref 98–111)
Creatinine, Ser: 1.18 mg/dL (ref 0.61–1.24)
GFR, Estimated: 60 mL/min — ABNORMAL LOW (ref >60)
Glucose, Bld: 109 mg/dL — ABNORMAL HIGH (ref 70–99)
Potassium: 3.8 mmol/L (ref 3.5–5.1)
Sodium: 135 mmol/L (ref 135–145)

## 2023-07-20 LAB — GLUCOSE, CAPILLARY: Glucose-Capillary: 110 mg/dL — ABNORMAL HIGH (ref 70–99)

## 2023-07-20 LAB — LEGIONELLA PNEUMOPHILA SEROGP 1 UR AG: L. pneumophila Serogp 1 Ur Ag: NEGATIVE

## 2023-07-20 LAB — CBC
HCT: 34.5 % — ABNORMAL LOW (ref 39.0–52.0)
Hemoglobin: 11.5 g/dL — ABNORMAL LOW (ref 13.0–17.0)
MCH: 29.6 pg (ref 26.0–34.0)
MCHC: 33.3 g/dL (ref 30.0–36.0)
MCV: 88.9 fL (ref 80.0–100.0)
Platelets: 181 10*3/uL (ref 150–400)
RBC: 3.88 MIL/uL — ABNORMAL LOW (ref 4.22–5.81)
RDW: 15.6 % — ABNORMAL HIGH (ref 11.5–15.5)
WBC: 24.6 10*3/uL — ABNORMAL HIGH (ref 4.0–10.5)
nRBC: 0 % (ref 0.0–0.2)

## 2023-07-20 LAB — HEPARIN LEVEL (UNFRACTIONATED)
Heparin Unfractionated: 0.28 [IU]/mL — ABNORMAL LOW (ref 0.30–0.70)
Heparin Unfractionated: 0.48 [IU]/mL (ref 0.30–0.70)
Heparin Unfractionated: 0.29 [IU]/mL — ABNORMAL LOW (ref 0.30–0.70)

## 2023-07-20 LAB — C DIFFICILE QUICK SCREEN W PCR REFLEX
C Diff antigen: NEGATIVE
C Diff interpretation: NOT DETECTED
C Diff toxin: NEGATIVE

## 2023-07-20 MED ORDER — HEPARIN BOLUS VIA INFUSION
1550.0000 [IU] | Freq: Once | INTRAVENOUS | Status: AC
  Administered 2023-07-20: 1550 [IU] via INTRAVENOUS
  Filled 2023-07-20: qty 1550

## 2023-07-20 MED ORDER — LOPERAMIDE HCL 2 MG PO CAPS
4.0000 mg | ORAL_CAPSULE | Freq: Once | ORAL | Status: AC
  Administered 2023-07-20: 4 mg via ORAL
  Filled 2023-07-20: qty 2

## 2023-07-20 MED ORDER — HEPARIN BOLUS VIA INFUSION
1500.0000 [IU] | Freq: Once | INTRAVENOUS | Status: DC
  Filled 2023-07-20: qty 1500

## 2023-07-20 NOTE — Plan of Care (Signed)
  Problem: Education: Goal: Knowledge of General Education information will improve Description: Including pain rating scale, medication(s)/side effects and non-pharmacologic comfort measures Outcome: Progressing   Problem: Health Behavior/Discharge Planning: Goal: Ability to manage health-related needs will improve Outcome: Progressing   Problem: Clinical Measurements: Goal: Ability to maintain clinical measurements within normal limits will improve Outcome: Progressing Goal: Will remain free from infection Outcome: Progressing Goal: Diagnostic test results will improve Outcome: Progressing Goal: Respiratory complications will improve Outcome: Progressing Goal: Cardiovascular complication will be avoided Outcome: Progressing   Problem: Activity: Goal: Risk for activity intolerance will decrease Outcome: Progressing   Problem: Coping: Goal: Level of anxiety will decrease Outcome: Progressing   Problem: Elimination: Goal: Will not experience complications related to bowel motility Outcome: Progressing Goal: Will not experience complications related to urinary retention Outcome: Progressing   Problem: Safety: Goal: Ability to remain free from injury will improve Outcome: Progressing   Problem: Activity: Goal: Ability to tolerate increased activity will improve Outcome: Progressing   Problem: Respiratory: Goal: Ability to maintain adequate ventilation will improve Outcome: Progressing Goal: Ability to maintain a clear airway will improve Outcome: Progressing

## 2023-07-20 NOTE — Consult Note (Signed)
 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   Allergies  Allergen Reactions   Simvastatin Other (See Comments)    Muscle pain   Niacin Other (See Comments) and Rash    Patient Measurements: Height: 6\' 2"  (188 cm) Weight: 107.8 kg (237 lb 10.5 oz) IBW/kg (Calculated) : 82.2 Heparin Dosing Weight: 104.3kg  Vital Signs: Temp: 98.2 F (36.8 C) (03/02 0025) Temp Source: Oral (03/01 2100) BP: 122/50 (03/02 0025) Pulse Rate: 61 (03/02 0025)  Labs: Recent Labs    07/18/23 1719 07/18/23 2037 07/19/23 0308 07/19/23 1432 07/20/23 0037  HGB 13.3  --  12.6*  --  11.5*  HCT 40.2  --  37.6*  --  34.5*  PLT 223  --  182  --  181  APTT  --  28  --   --   --   LABPROT  --  17.5*  --   --   --   INR  --  1.4*  --   --   --   HEPARINUNFRC  --   --  0.13* 0.12* 0.28*  CREATININE 1.67*  --  1.26*  --   --   CKTOTAL 49  --   --   --   --     Estimated Creatinine Clearance: 54 mL/min (A) (by C-G formula based on SCr of 1.26 mg/dL (H)).   Medical History: Past Medical History:  Diagnosis Date   CAD (coronary artery disease)    Hypertension     Medications:  NO AC prior to admission. Received Rx from [place]  Assessment: 89 y.o. male with history of HTN, HLD presenting today for weakness. 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 Pharmacy consulted to initiate and manage heparin infusion.  Baseline aPTT and INR ordered by MD still pending. Baseline Hgb and plt appropriate to  initiate heparin therapy.   Date Time HL Rate/Comment  3/01 1432 0.12 Subtherapeutic on 2100 u/hr 3/02     0037   0.28     SUBtherapeutic @ 2400 u/hr  Goal of Therapy:  Heparin level 0.3-0.7 units/ml Monitor platelets by anticoagulation protocol: Yes   Plan:  3/02:  HL @ 0037 = 0.28, SUBtherapeutic - Will order heparin 1550 units IV X 1 bolus and increase drip rate to 2600  units/hr Check heparin level in 8 hours Monitor CBC and signs/symptoms of bleeding  Thank you for involving pharmacy in this patient's care.   Kayela Humphres D Clinical Pharmacist 07/20/2023 1:32 AM

## 2023-07-20 NOTE — Consult Note (Signed)
 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   Allergies  Allergen Reactions   Simvastatin Other (See Comments)    Muscle pain   Niacin Other (See Comments) and Rash    Patient Measurements: Height: 6\' 2"  (188 cm) Weight: 102.7 kg (226 lb 6.6 oz) IBW/kg (Calculated) : 82.2 Heparin Dosing Weight: 104.3kg  Vital Signs: Temp: 98.9 F (37.2 C) (03/02 2021) BP: 152/73 (03/02 2021) Pulse Rate: 99 (03/02 2021)  Labs: Recent Labs    07/18/23 1719 07/18/23 2037 07/19/23 0308 07/19/23 1432 07/20/23 0037 07/20/23 1014 07/20/23 1932  HGB 13.3  --  12.6*  --  11.5*  --  10.2*  HCT 40.2  --  37.6*  --  34.5*  --  30.9*  PLT 223  --  182  --  181  --  212  APTT  --  28  --   --   --   --   --   LABPROT  --  17.5*  --   --   --   --   --   INR  --  1.4*  --   --   --   --   --   HEPARINUNFRC  --   --  0.13*   < > 0.28* 0.48 0.29*  CREATININE 1.67*  --  1.26*  --   --  1.18  --   CKTOTAL 49  --   --   --   --   --   --    < > = values in this interval not displayed.    Estimated Creatinine Clearance: 56.4 mL/min (by C-G formula based on SCr of 1.18 mg/dL).   Medical History: Past Medical History:  Diagnosis Date   CAD (coronary artery disease)    Hypertension     Medications:  NO AC prior to admission. Received Rx from [place]  Assessment: 89 y.o. male with history of HTN, HLD presenting today for weakness. 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 Pharmacy consulted to initiate and manage heparin infusion.  Baseline aPTT and INR ordered by MD still pending. Baseline Hgb and plt appropriate to  initiate heparin therapy.   Date Time HL Rate/Comment  3/01 1432 0.12 Subtherapeutic on 2100 u/hr 3/02     0037   0.28     SUBtherapeutic @ 2400 u/hr 3/02 1014 0.48 Therapeutic x 1 3/02 1932 0.29 SUBtherapeutic  Goal of Therapy:  Heparin level 0.3-0.7  units/ml Monitor platelets by anticoagulation protocol: Yes   Plan:  No issues with infusion noted Give heparin bolus of 1500 units x1 Increase heparin infusion to 2800 units/hour Check heparin level in 8 hours Check HL with AM labs when stable. Monitor CBC and signs/symptoms of bleeding.  Thank you for involving pharmacy in this patient's care.   Rockwell Alexandria, PharmD Clinical Pharmacist 07/20/2023 9:25 PM

## 2023-07-20 NOTE — Consult Note (Signed)
 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   Allergies  Allergen Reactions   Simvastatin Other (See Comments)    Muscle pain   Niacin Other (See Comments) and Rash    Patient Measurements: Height: 6\' 2"  (188 cm) Weight: 102.7 kg (226 lb 6.6 oz) IBW/kg (Calculated) : 82.2 Heparin Dosing Weight: 104.3kg  Vital Signs: Temp: 98.9 F (37.2 C) (03/02 2021) BP: 152/73 (03/02 2021) Pulse Rate: 99 (03/02 2021)  Labs: Recent Labs    07/18/23 1719 07/18/23 2037 07/19/23 0308 07/19/23 1432 07/20/23 0037 07/20/23 1014 07/20/23 1932  HGB 13.3  --  12.6*  --  11.5*  --  10.2*  HCT 40.2  --  37.6*  --  34.5*  --  30.9*  PLT 223  --  182  --  181  --  212  APTT  --  28  --   --   --   --   --   LABPROT  --  17.5*  --   --   --   --   --   INR  --  1.4*  --   --   --   --   --   HEPARINUNFRC  --   --  0.13*   < > 0.28* 0.48 0.29*  CREATININE 1.67*  --  1.26*  --   --  1.18  --   CKTOTAL 49  --   --   --   --   --   --    < > = values in this interval not displayed.    Estimated Creatinine Clearance: 56.4 mL/min (by C-G formula based on SCr of 1.18 mg/dL).   Medical History: Past Medical History:  Diagnosis Date   CAD (coronary artery disease)    Hypertension     Medications:  NO AC prior to admission. Received Rx from [place]  Assessment: 89 y.o. male with history of HTN, HLD presenting today for weakness. 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 Pharmacy consulted to initiate and manage heparin infusion.  Baseline aPTT and INR ordered by MD still pending. Baseline Hgb and plt appropriate to  initiate heparin therapy.   Date Time HL Rate/Comment  3/01 1432 0.12 Subtherapeutic on 2100 u/hr 3/02     0037   0.28     SUBtherapeutic @ 2400 u/hr 3/02 1014 0.48 Therapeutic x 1 3/02 1932 0.29 SUBtherapeutic  Goal of Therapy:  Heparin level 0.3-0.7  units/ml Monitor platelets by anticoagulation protocol: Yes   Plan:  3/2:  RN called @ ~ 2300 to say pt was having dark stool and bleeding around penis area.  Also recently coughed up phlegm with streaks of blood.  H and H trending down.  Notified NP of situation;  STAT CBC ordered, heparin placed on hold pending results of CBC.   Thank you for involving pharmacy in this patient's care.   Keishawna Carranza D Clinical Pharmacist 07/20/2023 11:34 PM

## 2023-07-20 NOTE — Plan of Care (Signed)
  Problem: Education: Goal: Knowledge of General Education information will improve Description: Including pain rating scale, medication(s)/side effects and non-pharmacologic comfort measures 07/20/2023 1455 by Lars Pinks, RN Outcome: Progressing 07/20/2023 1455 by Lars Pinks, RN Outcome: Progressing   Problem: Health Behavior/Discharge Planning: Goal: Ability to manage health-related needs will improve 07/20/2023 1455 by Lars Pinks, RN Outcome: Progressing 07/20/2023 1455 by Lars Pinks, RN Outcome: Progressing   Problem: Clinical Measurements: Goal: Ability to maintain clinical measurements within normal limits will improve 07/20/2023 1455 by Lars Pinks, RN Outcome: Progressing 07/20/2023 1455 by Lars Pinks, RN Outcome: Progressing Goal: Will remain free from infection 07/20/2023 1455 by Lars Pinks, RN Outcome: Progressing 07/20/2023 1455 by Lars Pinks, RN Outcome: Progressing Goal: Diagnostic test results will improve 07/20/2023 1455 by Lars Pinks, RN Outcome: Progressing 07/20/2023 1455 by Lars Pinks, RN Outcome: Progressing Goal: Respiratory complications will improve 07/20/2023 1455 by Lars Pinks, RN Outcome: Progressing 07/20/2023 1455 by Lars Pinks, RN Outcome: Progressing Goal: Cardiovascular complication will be avoided 07/20/2023 1455 by Lars Pinks, RN Outcome: Progressing 07/20/2023 1455 by Lars Pinks, RN Outcome: Progressing   Problem: Activity: Goal: Risk for activity intolerance will decrease 07/20/2023 1455 by Lars Pinks, RN Outcome: Progressing 07/20/2023 1455 by Lars Pinks, RN Outcome: Progressing   Problem: Nutrition: Goal: Adequate nutrition will be maintained 07/20/2023 1455 by Lars Pinks, RN Outcome: Progressing 07/20/2023 1455 by Lars Pinks, RN Outcome: Progressing   Problem: Coping: Goal: Level of anxiety will decrease 07/20/2023 1455 by Lars Pinks, RN Outcome:  Progressing 07/20/2023 1455 by Lars Pinks, RN Outcome: Progressing   Problem: Elimination: Goal: Will not experience complications related to bowel motility 07/20/2023 1455 by Lars Pinks, RN Outcome: Progressing 07/20/2023 1455 by Lars Pinks, RN Outcome: Progressing Goal: Will not experience complications related to urinary retention 07/20/2023 1455 by Lars Pinks, RN Outcome: Progressing 07/20/2023 1455 by Lars Pinks, RN Outcome: Progressing   Problem: Pain Managment: Goal: General experience of comfort will improve and/or be controlled 07/20/2023 1455 by Lars Pinks, RN Outcome: Progressing 07/20/2023 1455 by Lars Pinks, RN Outcome: Progressing   Problem: Safety: Goal: Ability to remain free from injury will improve 07/20/2023 1455 by Lars Pinks, RN Outcome: Progressing 07/20/2023 1455 by Lars Pinks, RN Outcome: Progressing   Problem: Skin Integrity: Goal: Risk for impaired skin integrity will decrease 07/20/2023 1455 by Lars Pinks, RN Outcome: Progressing 07/20/2023 1455 by Lars Pinks, RN Outcome: Progressing   Problem: Activity: Goal: Ability to tolerate increased activity will improve 07/20/2023 1455 by Lars Pinks, RN Outcome: Progressing 07/20/2023 1455 by Lars Pinks, RN Outcome: Progressing   Problem: Clinical Measurements: Goal: Ability to maintain a body temperature in the normal range will improve 07/20/2023 1455 by Lars Pinks, RN Outcome: Progressing 07/20/2023 1455 by Lars Pinks, RN Outcome: Progressing   Problem: Respiratory: Goal: Ability to maintain adequate ventilation will improve 07/20/2023 1455 by Lars Pinks, RN Outcome: Progressing 07/20/2023 1455 by Lars Pinks, RN Outcome: Progressing Goal: Ability to maintain a clear airway will improve 07/20/2023 1455 by Lars Pinks, RN Outcome: Progressing 07/20/2023 1455 by Lars Pinks, RN Outcome: Progressing

## 2023-07-20 NOTE — Hospital Course (Signed)
 89yo with h/o HTN, HLD, CAD, chronic diastolic CHF, gout, and class 1 obesity who presented on 2/28 with n/v and cough and was found to have sepsis due to multifocal PNA as well as an extensive nonocclusive thrombus of the portal and superior mesenteric veins and a L renal mass. He was started on Ceftriaxone and doxycycline with improvement in sepsis physiology. He was also started on heparin infusion with vascular surgery consultation (no other recommendations).  Oncology will see in short-term follow up after discharge.

## 2023-07-20 NOTE — Progress Notes (Signed)
 Progress Note   Patient: Steven Jacobson WUJ:[id number] DOB: [birth year] DOA: 07/18/2023     2 DOS: the patient was seen and examined on 07/20/2023   Brief hospital course: 89yo with h/o HTN, HLD, CAD, chronic diastolic CHF, gout, and class 1 obesity who presented on 2/28 with n/v and cough and was found to have sepsis due to multifocal PNA as well as an extensive nonocclusive thrombus of the portal and superior mesenteric veins and a L renal mass. He was started on Ceftriaxone and doxycycline with improvement in sepsis physiology. He was also started on heparin infusion with vascular surgery consultation (no other recommendations).  Oncology will see in short-term follow up after discharge.  Assessment and Plan:  Severe sepsis due to CAP Patient has 2 L new oxygen requirement Patient met criteria for severe sepsis with WBC 29.9, heart rate up to 120s, RR 26, lactic acid 2.3 --> 2.0; also with altered mental status and AKI Admitted to progressive care -> telemetry  IV Rocephin and doxycycline (patient received 1 dose of vancomycin and Flagyl in ED) Incentive spirometry Mucinex for cough  Bronchodilators Urine legionella and S. pneumococcal antigen Follow up blood culture x2, sputum culture   Superior mesenteric vein thrombosis/portal vein thrombosis Consulted Dr. Evie Lacks of [facility] who recommended IV heparin and supportive care, serial exams; vascular surgery signed off Continue heparin Denies prior h/o VTE  With renal mass and large thyroid nodule, possible hypercoagulable associated with underlying malignancy? Discussed via Secure Chat with oncology re: inpatient vs. Short-term outpatient f/u - Dr. Alena Bills will arrange for outpatient cancer center appointment  Diarrhea Developed significant (5-6 BMs overnight) diarrhea since yesterday Will order GI pathogen panel and C diff If negative, will add anti-diarrheal medication   Acute metabolic encephalopathy Likely multifactorial etiology, including  severe sepsis, hypoxia, worsening renal function CT head negative Fall precautions   CAD (coronary artery disease) Continue Aspirin, Lipitor   Essential hypertension Hold blood pressure medications currently including HCTZ, metoprolol, lisinopril due to severe sepsis and soft blood pressure BPs 108/78-109/68 today   Chronic diastolic CHF (congestive heart failure) 2D echo 04/13/2021 showed EF of 50-55% with grade 1 diastolic dysfunction CHF seems to be compensated   AKI (acute kidney injury) Likely due to dehydration and continuation of diuretics and lisinopril in the setting of severe sepsis Hold HCTZ and lisinopril IV fluid given Improving   Renal mass and  thyroid nodule Incidental findings by CT scan Need to follow-up with PCP as outpatient   History of SVT (supraventricular tachycardia)  EKG showed a wide-complex tachycardia As needed IV metoprolol 2.5 mg every 2 hour for heart rate> 125 HR currently 94   Obesity (BMI 30-39.9) Body mass index is 30.51 kg/m.Marland Kitchen  Weight loss should be encouraged Outpatient PCP/bariatric medicine f/u encouraged    DNR Discussed with patient  He prefers to "die naturally" should that situation arise Code status changed       Consultants: Vascular surgery RT   Procedures: None   Antibiotics: Ceftriaxone 2/28- Doxycycline 2/28- Vancomycin x 1 Metronidazole x 1    30 Day Unplanned Readmission Risk Score    Flowsheet Row ED to Hosp-Admission (Current) from 07/18/2023 in [facility] PCU  30 Day Unplanned Readmission Risk Score (%) 18.86 Filed at 07/20/2023 0801       This score is the patient's risk of an unplanned readmission within 30 days of being discharged (0 -100%). The score is based on dignosis, age, lab data, medications, orders, and past utilization.  Low:  0-14.9   Medium: 15-21.9   High: 22-29.9   Extreme: 30 and above           Subjective: Feeling ok.  No c/o pain, acknowledges  diarrhea but seems less concerned about this than nursing staff is.     Objective: Vitals:   07/20/23 0913 07/20/23 1340  BP: 109/68 108/78  Pulse: 89 86  Resp: 18 18  Temp: 97.9 F (36.6 C) 97.9 F (36.6 C)  SpO2: 99% 98%    Intake/Output Summary (Last 24 hours) at 07/20/2023 1512 Last data filed at 07/20/2023 1150 Gross per 24 hour  Intake 600 ml  Output 356 ml  Net 244 ml   Filed Weights   07/18/23 1954 07/20/23 0516  Weight: 107.8 kg 102.7 kg    Exam:  General:  Appears calm and comfortable and is in NAD, on [place] O2 Eyes:  EOMI, normal lids, iris ENT:  grossly normal hearing, lips & tongue, mmm Cardiovascular:  RRR, no m/r/g. No LE edema.  Respiratory:   CTAB.  Normal respiratory effort.  On  O2 Abdomen:  soft, NT, ND Skin:  no rash or induration seen on limited exam Musculoskeletal:  grossly normal tone BUE/BLE, good ROM, no bony abnormality Psychiatric:  blunted mood and affect, speech fluent and appropriate, AOx3 Neurologic:  CN 2-12 grossly intact, moves all extremities in coordinated fashion  Data Reviewed: I have reviewed the patient's lab results since admission.  Pertinent labs for today include:   BMP pending WBC 24.6, slightly improved from 25.1 Hgb 11.5 Blood cultures NTD x 2 days Sputum culture pending     Family Communication: None present; I was unable to reach his son in [place] by telephone today  Disposition: Status is: Inpatient Remains inpatient appropriate because: ongoing management     Time spent: 50 minutes  Unresulted Labs (From admission, onward)     Start     Ordered   07/21/23 0500  Basic metabolic panel  Tomorrow morning,   R        07/20/23 0817   07/21/23 0500  CBC with Differential/Platelet  Tomorrow morning,   R        07/20/23 0817   07/20/23 1800  Heparin level (unfractionated)  Once-Timed,   TIMED        07/20/23 1139   07/20/23 1033  C Difficile Quick Screen w PCR reflex  (C Difficile quick screen w PCR  reflex panel )  Once, for 24 hours,   TIMED       References:    CDiff Information Tool   07/20/23 1032   07/20/23 1033  Gastrointestinal Panel by PCR , Stool  (Gastrointestinal Panel by PCR, Stool                                                                                                                                                     **  Does Not include CLOSTRIDIUM DIFFICILE testing. **If CDIFF testing is needed, place order from the "C Difficile Testing" order set.**)  Once,   R        07/20/23 1032   07/18/23 2204  Legionella Pneumophila Serogp 1 Ur Ag  Once,   R        07/18/23 2204             Author: Jonah Blue, MD 07/20/2023 3:12 PM  For on call review [email or web address].

## 2023-07-20 NOTE — Consult Note (Signed)
 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   Allergies  Allergen Reactions   Simvastatin Other (See Comments)    Muscle pain   Niacin Other (See Comments) and Rash    Patient Measurements: Height: 6\' 2"  (188 cm) Weight: 102.7 kg (226 lb 6.6 oz) IBW/kg (Calculated) : 82.2 Heparin Dosing Weight: 104.3kg  Vital Signs: Temp: 97.9 F (36.6 C) (03/02 0913) BP: 109/68 (03/02 0913) Pulse Rate: 89 (03/02 0913)  Labs: Recent Labs    07/18/23 1719 07/18/23 1719 07/18/23 2037 07/19/23 0308 07/19/23 1432 07/20/23 0037 07/20/23 1014  HGB 13.3  --   --  12.6*  --  11.5*  --   HCT 40.2  --   --  37.6*  --  34.5*  --   PLT 223  --   --  182  --  181  --   APTT  --   --  28  --   --   --   --   LABPROT  --   --  17.5*  --   --   --   --   INR  --   --  1.4*  --   --   --   --   HEPARINUNFRC  --    < >  --  0.13* 0.12* 0.28* 0.48  CREATININE 1.67*  --   --  1.26*  --   --  1.18  CKTOTAL 49  --   --   --   --   --   --    < > = values in this interval not displayed.    Estimated Creatinine Clearance: 56.4 mL/min (by C-G formula based on SCr of 1.18 mg/dL).   Medical History: Past Medical History:  Diagnosis Date   CAD (coronary artery disease)    Hypertension     Medications:  NO AC prior to admission. Received Rx from [place]  Assessment: 89 y.o. male with history of HTN, HLD presenting today for weakness. 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 Pharmacy consulted to initiate and manage heparin infusion.  Baseline aPTT and INR ordered by MD still pending. Baseline Hgb and plt appropriate to  initiate heparin therapy.   Date Time HL Rate/Comment  3/01 1432 0.12 Subtherapeutic on 2100 u/hr 3/02     0037   0.28     SUBtherapeutic @ 2400 u/hr 3/02 1014 0.48 Therapeutic x 1  Goal of Therapy:  Heparin level 0.3-0.7 units/ml Monitor platelets by  anticoagulation protocol: Yes   Plan:  Heparin level therapeutic x 1 at current heparin rate of 2600 un/hr. Will continue current regimen and repeat HL in 8 hr to confirm. Check HL with AM labs when stable. Monitor CBC and signs/symptoms of bleeding.  Chandlar Guice Rodriguez-Guzman PharmD, BCPS 07/20/2023 11:35 AM

## 2023-07-21 ENCOUNTER — Ambulatory Visit: Payer: Medicare Other | Admitting: Family Medicine

## 2023-07-21 ENCOUNTER — Other Ambulatory Visit (HOSPITAL_COMMUNITY): Payer: Self-pay

## 2023-07-21 ENCOUNTER — Inpatient Hospital Stay

## 2023-07-21 DIAGNOSIS — J189 Pneumonia, unspecified organism: Secondary | ICD-10-CM | POA: Diagnosis not present

## 2023-07-21 DIAGNOSIS — A419 Sepsis, unspecified organism: Secondary | ICD-10-CM | POA: Diagnosis not present

## 2023-07-21 LAB — BASIC METABOLIC PANEL
Anion gap: 4 — ABNORMAL LOW (ref 5–15)
BUN: 57 mg/dL — ABNORMAL HIGH (ref 8–23)
CO2: 18 mmol/L — ABNORMAL LOW (ref 22–32)
Calcium: 7.5 mg/dL — ABNORMAL LOW (ref 8.9–10.3)
Chloride: 112 mmol/L — ABNORMAL HIGH (ref 98–111)
Creatinine, Ser: 1.07 mg/dL (ref 0.61–1.24)
GFR, Estimated: >60 mL/min (ref >60)
Glucose, Bld: 99 mg/dL (ref 70–99)
Potassium: 4.2 mmol/L (ref 3.5–5.1)
Sodium: 134 mmol/L — ABNORMAL LOW (ref 135–145)

## 2023-07-21 LAB — CBC WITH DIFFERENTIAL/PLATELET
Abs Immature Granulocytes: 0.9 10*3/uL — ABNORMAL HIGH (ref 0.00–0.07)
Abs Immature Granulocytes: 0.86 10*3/uL — ABNORMAL HIGH (ref 0.00–0.07)
Basophils Absolute: 0.1 10*3/uL (ref 0.0–0.1)
Basophils Absolute: 0.1 10*3/uL (ref 0.0–0.1)
Basophils Relative: 1 %
Basophils Relative: 1 %
Eosinophils Absolute: 0.3 10*3/uL (ref 0.0–0.5)
Eosinophils Absolute: 0.3 10*3/uL (ref 0.0–0.5)
Eosinophils Relative: 1 %
Eosinophils Relative: 1 %
HCT: 26.8 % — ABNORMAL LOW (ref 39.0–52.0)
HCT: 26.8 % — ABNORMAL LOW (ref 39.0–52.0)
Hemoglobin: 8.9 g/dL — ABNORMAL LOW (ref 13.0–17.0)
Hemoglobin: 8.8 g/dL — ABNORMAL LOW (ref 13.0–17.0)
Immature Granulocytes: 4 %
Immature Granulocytes: 4 %
Lymphocytes Relative: 10 %
Lymphocytes Relative: 13 %
Lymphs Abs: 2.3 10*3/uL (ref 0.7–4.0)
Lymphs Abs: 3 10*3/uL (ref 0.7–4.0)
MCH: 28.8 pg (ref 26.0–34.0)
MCH: 29.5 pg (ref 26.0–34.0)
MCHC: 33.2 g/dL (ref 30.0–36.0)
MCHC: 32.8 g/dL (ref 30.0–36.0)
MCV: 86.7 fL (ref 80.0–100.0)
MCV: 89.9 fL (ref 80.0–100.0)
Monocytes Absolute: 1.6 10*3/uL — ABNORMAL HIGH (ref 0.1–1.0)
Monocytes Absolute: 1.6 10*3/uL — ABNORMAL HIGH (ref 0.1–1.0)
Monocytes Relative: 7 %
Monocytes Relative: 7 %
Neutro Abs: 18.1 10*3/uL — ABNORMAL HIGH (ref 1.7–7.7)
Neutro Abs: 17.7 10*3/uL — ABNORMAL HIGH (ref 1.7–7.7)
Neutrophils Relative %: 77 %
Neutrophils Relative %: 74 %
Platelets: 189 10*3/uL (ref 150–400)
Platelets: 199 10*3/uL (ref 150–400)
RBC: 3.09 MIL/uL — ABNORMAL LOW (ref 4.22–5.81)
RBC: 2.98 MIL/uL — ABNORMAL LOW (ref 4.22–5.81)
RDW: 15.9 % — ABNORMAL HIGH (ref 11.5–15.5)
RDW: 15.9 % — ABNORMAL HIGH (ref 11.5–15.5)
WBC: 23.2 10*3/uL — ABNORMAL HIGH (ref 4.0–10.5)
WBC: 23.6 10*3/uL — ABNORMAL HIGH (ref 4.0–10.5)
nRBC: 0 % (ref 0.0–0.2)
nRBC: 0 % (ref 0.0–0.2)

## 2023-07-21 LAB — CBC
HCT: 27.3 % — ABNORMAL LOW (ref 39.0–52.0)
Hemoglobin: 9 g/dL — ABNORMAL LOW (ref 13.0–17.0)
MCH: 29.2 pg (ref 26.0–34.0)
MCHC: 33 g/dL (ref 30.0–36.0)
MCV: 88.6 fL (ref 80.0–100.0)
Platelets: 194 10*3/uL (ref 150–400)
RBC: 3.08 MIL/uL — ABNORMAL LOW (ref 4.22–5.81)
RDW: 15.8 % — ABNORMAL HIGH (ref 11.5–15.5)
WBC: 25.5 10*3/uL — ABNORMAL HIGH (ref 4.0–10.5)
nRBC: 0 % (ref 0.0–0.2)

## 2023-07-21 LAB — TYPE AND SCREEN
ABO/RH(D): A POS
Antibody Screen: NEGATIVE

## 2023-07-21 LAB — HEPARIN LEVEL (UNFRACTIONATED): Heparin Unfractionated: <0.1 [IU]/mL — ABNORMAL LOW (ref 0.30–0.70)

## 2023-07-21 MED ORDER — DOXYCYCLINE HYCLATE 100 MG PO TABS
100.0000 mg | ORAL_TABLET | Freq: Two times a day (BID) | ORAL | Status: AC
  Administered 2023-07-21 – 2023-07-23 (×5): 100 mg via ORAL
  Filled 2023-07-21 (×5): qty 1

## 2023-07-21 MED ORDER — IOHEXOL 350 MG/ML SOLN
100.0000 mL | Freq: Once | INTRAVENOUS | Status: AC | PRN
  Administered 2023-07-21: 100 mL via INTRAVENOUS

## 2023-07-21 NOTE — Plan of Care (Signed)

## 2023-07-21 NOTE — Progress Notes (Addendum)
 Progress Note   Patient: Steven Jacobson KGU:[id number] DOB: June 06, 1933 DOA: 07/18/2023     3 DOS: the patient was seen and examined on 07/21/2023   Brief hospital course: 89yo with h/o HTN, HLD, CAD, chronic diastolic CHF, gout, and class 1 obesity who presented on 2/28 with n/v and cough and was found to have sepsis due to multifocal PNA as well as an extensive nonocclusive thrombus of the portal and superior mesenteric veins and a L renal mass. He was started on Ceftriaxone and doxycycline with improvement in sepsis physiology. He was also started on heparin infusion with vascular surgery consultation (no other recommendations).  Oncology will see in short-term follow up after discharge.  Assessment and Plan:  Severe sepsis due to CAP Patient has 2 L new oxygen requirement Patient met criteria for severe sepsis with WBC 29.9, heart rate up to 120s, RR 26, lactic acid 2.3 --> 2.0; also with altered mental status and AKI Admitted to progressive care -> telemetry  IV Rocephin and doxycycline (patient received 1 dose of vancomycin and Flagyl in ED) Incentive spirometry Mucinex for cough  Bronchodilators Urine legionella and S. pneumococcal antigen Follow up blood culture x2, sputum culture Improving from this standpoint Nurse has requested SLP swallow evaluation Will need PT/OT evaluations once more medically stable   Superior mesenteric vein thrombosis/portal vein thrombosis Consulted Dr. Evie Lacks of [facility] who recommended IV heparin and supportive care, serial exams; vascular surgery signed off Started on heparin but he developed GI/GU bleeding Denies prior h/o VTE  With renal mass and large thyroid nodule, possible hypercoagulable associated with underlying malignancy? Discussed via Secure Chat with oncology re: inpatient vs. Short-term outpatient f/u - Dr. Alena Bills will arrange for outpatient cancer center appointment Vascular reconsulted for assistance and recognizes the challenge of the situation  - intervention is not possible and yet he is currently unable to tolerate [facility] For now, will add ASA 81 mg daily and continue to monitor for improvement in bleeding Possibly will re-trial heparin low-dose and without bolus once stable Given this tenuous situation, palliative care has been consulted   Diarrhea Developed significant (5-6 BMs overnight) diarrhea since yesterday GI pathogen panel and C diff negative Diarrhea is bloody at this point, also with gross hematuria Heparin stopped  ABLA On Heparin, patient started with GU/GI bleeding Heparin stopped Hgb appears to be stable this AM at 8.9, down from 12.6 on 3/1 Will recheck in AM   Acute metabolic encephalopathy CT head negative Fall precautions Metabolic acidosis, likely not clinically significant Son reports concerns for developing dementia so this issue may not be as acute as anticipated   CAD (coronary artery disease) Continue Aspirin, Lipitor   Essential hypertension Hold blood pressure medications currently including HCTZ, metoprolol, lisinopril due to severe sepsis and soft blood pressure BPs 108/78-152 today   Chronic diastolic CHF (congestive heart failure) 2D echo 04/13/2021 showed EF of 50-55% with grade 1 diastolic dysfunction CHF seems to be compensated   AKI (acute kidney injury) Likely due to dehydration and continuation of diuretics and lisinopril in the setting of severe sepsis Hyponatremia may be related to this but is stable and not clinically significant Hold HCTZ and lisinopril IV fluid given Improving   Renal mass and  thyroid nodule Incidental findings by CT scan Need to follow-up with PCP as outpatient   History of SVT (supraventricular tachycardia)  EKG showed a wide-complex tachycardia As needed IV metoprolol 2.5 mg every 2 hour for heart rate> 125 HR currently 94  Pressure  ulcer Pressure Injury 07/20/23 Buttocks Right Stage 2 -  Partial thickness loss of dermis presenting as a shallow  open injury with a red, pink wound bed without slough. on right side of gluteal cleft (Active)  07/20/23 1210  Location: Buttocks  Location Orientation: Right  Staging: Stage 2 -  Partial thickness loss of dermis presenting as a shallow open injury with a red, pink wound bed without slough.  Wound Description (Comments): on right side of gluteal cleft  Present on Admission: -- (unknown)     Obesity (BMI 30-39.9) Body mass index is 30.51 kg/m.Marland Kitchen  Weight loss should be encouraged Outpatient PCP/bariatric medicine f/u encouraged    DNR Discussed with patient  He prefers to "die naturally" should that situation arise Code status changed Son agrees that this is likely consistent with his wishes       Consultants: Vascular surgery Palliative care RT SLP TOC team   Procedures: None   Antibiotics: Ceftriaxone 2/28- Doxycycline 2/28- Vancomycin x 1 Metronidazole x 1  30 Day Unplanned Readmission Risk Score    Flowsheet Row ED to Hosp-Admission (Current) from 07[facility]18/2023 in [facility] PCU  30 Day Unplanned Readmission Risk Score (%) 16.6 Filed at 07/21/2023 0401       This score is the patient's risk of an unplanned readmission within 30 days of being discharged (0 -100%). The score is based on dignosis, age, lab data, medications, orders, and past utilization.   Low:  0-14.9   Medium: 15-21.9   High: 22-29.9   Extreme: 30 and above           Subjective: Feeling ok, no complaints.  His son from [place] is here today.  He reports that there were concerns about dementia for the patient and he was already planning to be here. He understands the tenuous nature of his father's condition and is open to speaking with palliative care.   Objective: Vitals:   07/21/23 0732 07/21/23 1225  BP: 108/61 102/65  Pulse: 60 98  Resp: 16   Temp: 97.8 F (36.6 C) (!) 97.3 F (36.3 C)  SpO2: 99% 97%    Intake/Output Summary (Last 24 hours) at 07/21/2023 1527 Last  data filed at 07/21/2023 1500 Gross per 24 hour  Intake 300 ml  Output 45 ml  Net 255 ml   Filed Weights   07/18/23 1954 07/20/23 0516  Weight: 107.8 kg 102.7 kg    Exam:  General:  Appears calm and comfortable and is in NAD, on [place] O2 Eyes:  EOMI, normal lids, iris ENT:  grossly normal hearing, lips & tongue, mmm Cardiovascular:  RRR, no m/r/g. No LE edema.  Respiratory:   CTAB.  Normal respiratory effort.  On [place] O2 Abdomen:  soft, NT, ND Skin:  no rash or induration seen on limited exam Musculoskeletal:  grossly normal tone BUE/BLE, good ROM, no bony abnormality Psychiatric:  blunted mood and affect, speech fluent and appropriate, AOx3 Neurologic:  CN 2-12 grossly intact, moves all extremities in coordinated fashion  Data Reviewed: I have reviewed the patient's lab results since admission.  Pertinent labs for today include:   Na++ 134, stable CO2 18 BUN 57/Creatinine 1.07/GFR >60 WBC 23.2 Hgb 8.9, down from 12.6 on 3/1 but stable since 2342 last night     Family Communication: Son was present throughout evaluation  Disposition: Status is: Inpatient Remains inpatient appropriate because: ongoing management     Time spent: 50 minutes  Unresulted Labs (From admission, onward)  Start     Ordered   07/22/23 0500  CBC with Differential/Platelet  Tomorrow morning,   R       Question:  Specimen collection method  Answer:  Lab=Lab collect   07/21/23 1527   07/22/23 0500  Basic metabolic panel  Tomorrow morning,   R       Question:  Specimen collection method  Answer:  Lab=Lab collect   07/21/23 1527             Author: Jonah Blue, MD 07/21/2023 3:27 PM  For on call review [email or web address].

## 2023-07-21 NOTE — Plan of Care (Signed)
 Pt has had 6-7 black bowel movements overnight ranging medium to large in quantity, with one small BM. Pt incontinent for bowel and bladder. Urine color red with some dark red clots. Pharmacist and provider made aware of this when order received to increase heparin gtt with bolus. STAT CBC ordered by provider and heparin gtt d/c'ed. Hgb 9.0 dropped from 10.2. at 1900. CTA of abdomen competed.    Problem: Education: Goal: Knowledge of General Education information will improve Description: Including pain rating scale, medication(s)/side effects and non-pharmacologic comfort measures Outcome: Progressing   Problem: Health Behavior/Discharge Planning: Goal: Ability to manage health-related needs will improve Outcome: Progressing   Problem: Activity: Goal: Risk for activity intolerance will decrease Outcome: Progressing   Problem: Pain Managment: Goal: General experience of comfort will improve and/or be controlled Outcome: Progressing   Problem: Safety: Goal: Ability to remain free from injury will improve Outcome: Progressing   Problem: Skin Integrity: Goal: Risk for impaired skin integrity will decrease Outcome: Progressing   Problem: Respiratory: Goal: Ability to maintain adequate ventilation will improve Outcome: Progressing

## 2023-07-21 NOTE — Progress Notes (Signed)
 Pt return back from CT. Call bell within reach. Bed alarm on.   CCMD notified

## 2023-07-21 NOTE — Care Management Important Message (Signed)
 Important Message  Patient Details  Name: Steven Jacobson MRN: [id number] Date of Birth: 22-Apr-1934   Important Message Given:  Yes - Medicare IM     Partick Musselman W, CMA 07/21/2023, 10:59 AM

## 2023-07-21 NOTE — Progress Notes (Signed)
 Pt went down for a CT with transporter. CCMD made aware

## 2023-07-21 NOTE — Progress Notes (Signed)
  Progress Note    07/21/2023 11:15 AM * No surgery found *  Subjective: Steven Jacobson is an 89 yom h/o HTN, CAD- h/o CABG on ASA/Plavix, presented to ER with several days of confusion, nausea, vomiting and productive cough. Found to be septic with pneumonia; UTI. 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 Asked to evaluate for such. Patient confused upon exam but denies abdominal pain or nausea. Patient's sons were at the bedside this morning. We discussed in detail their fathers condition and issues related to having a Portal Vein Thrombosis without anticoagulation.    Vitals:   07/21/23 0527 07/21/23 0732  BP: 115/64 108/61  Pulse: (!) 104 60  Resp: 20 16  Temp: 98.4 F (36.9 C) 97.8 F (36.6 C)  SpO2: 98% 99%   Physical Exam: Cardiac:  RRR, Normal S1, S2. No murmurs. Lungs:  Clear on auscultation throughout, Non  Labored breathing. No rales rhonchi or wheezing.  Incisions:  None Extremities:  Palpable pulses throughout. Bilateral lower extremities warm to touch Abdomen:  Positive bowel sounds throughout. soft, non-tender/non-distended. No guarding/reflex.  Neurologic: AAOX1, was able to express he knew his 2 sons who were in the room only.    CBC    Component Value Date/Time   WBC 23.2 (H) 07/21/2023 0524   RBC 3.09 (L) 07/21/2023 0524   HGB 8.9 (L) 07/21/2023 0524   HCT 26.8 (L) 07/21/2023 0524   PLT 189 07/21/2023 0524   MCV 86.7 07/21/2023 0524   MCH 28.8 07/21/2023 0524   MCHC 33.2 07/21/2023 0524   RDW 15.9 (H) 07/21/2023 0524   LYMPHSABS 2.3 07/21/2023 0524   MONOABS 1.6 (H) 07/21/2023 0524   EOSABS 0.3 07/21/2023 0524   BASOSABS 0.1 07/21/2023 0524    BMET    Component Value Date/Time   NA 134 (L) 07/21/2023 0524   K 4.2 07/21/2023 0524   CL 112 (H) 07/21/2023 0524   CO2 18 (L) 07/21/2023 0524   GLUCOSE 99 07/21/2023 0524   BUN 57 (H) 07/21/2023 0524   CREATININE 1.07 07/21/2023 0524    CALCIUM 7.5 (L) 07/21/2023 0524   GFRNONAA >60 07/21/2023 0524    INR    Component Value Date/Time   INR 1.4 (H) 07/18/2023 2037     Intake/Output Summary (Last 24 hours) at 07/21/2023 1115 Last data filed at 07/21/2023 0900 Gross per 24 hour  Intake 360 ml  Output 47 ml  Net 313 ml     Assessment/Plan:  89 y.o. male is diagnosed with Portal Vein Thrombosis * No surgery found *   PLAN No vascular surgery operative intervention at this time.  Unfortunately this is a very difficult situation.  The patient has a thrombus within the portal venous system and there is no direct access and very limited treatment options.  Any treatment of this condition carries a relatively high mortality if you are unable to tolerate anticoagulation.  Typically 1 option for direct intervention is a transhepatic approach but if the patient's bleeding and on blood thinners such as heparin this is not recommended.  Again there is no role for any surgical intervention at this time.  DVT prophylaxis: ASA 81 mg daily   Marcie [facility] 07/21/2023 11:15 AM

## 2023-07-22 DIAGNOSIS — Z7189 Other specified counseling: Secondary | ICD-10-CM | POA: Diagnosis not present

## 2023-07-22 DIAGNOSIS — J189 Pneumonia, unspecified organism: Secondary | ICD-10-CM | POA: Diagnosis not present

## 2023-07-22 DIAGNOSIS — A419 Sepsis, unspecified organism: Secondary | ICD-10-CM | POA: Diagnosis not present

## 2023-07-22 LAB — CULTURE, RESPIRATORY W GRAM STAIN

## 2023-07-22 LAB — CBC WITH DIFFERENTIAL/PLATELET
Abs Immature Granulocytes: 0.77 10*3/uL — ABNORMAL HIGH (ref 0.00–0.07)
Abs Immature Granulocytes: 1.06 10*3/uL — ABNORMAL HIGH (ref 0.00–0.07)
Basophils Absolute: 0.1 10*3/uL (ref 0.0–0.1)
Basophils Absolute: 0.1 10*3/uL (ref 0.0–0.1)
Basophils Relative: 1 %
Basophils Relative: 1 %
Eosinophils Absolute: 0.4 10*3/uL (ref 0.0–0.5)
Eosinophils Absolute: 0.5 10*3/uL (ref 0.0–0.5)
Eosinophils Relative: 2 %
Eosinophils Relative: 2 %
HCT: 25.3 % — ABNORMAL LOW (ref 39.0–52.0)
HCT: 27.6 % — ABNORMAL LOW (ref 39.0–52.0)
Hemoglobin: 8.4 g/dL — ABNORMAL LOW (ref 13.0–17.0)
Hemoglobin: 8.8 g/dL — ABNORMAL LOW (ref 13.0–17.0)
Immature Granulocytes: 4 %
Immature Granulocytes: 4 %
Lymphocytes Relative: 11 %
Lymphocytes Relative: 12 %
Lymphs Abs: 2.3 10*3/uL (ref 0.7–4.0)
Lymphs Abs: 3 10*3/uL (ref 0.7–4.0)
MCH: 29.3 pg (ref 26.0–34.0)
MCH: 29.1 pg (ref 26.0–34.0)
MCHC: 33.2 g/dL (ref 30.0–36.0)
MCHC: 31.9 g/dL (ref 30.0–36.0)
MCV: 88.2 fL (ref 80.0–100.0)
MCV: 91.4 fL (ref 80.0–100.0)
Monocytes Absolute: 1.1 10*3/uL — ABNORMAL HIGH (ref 0.1–1.0)
Monocytes Absolute: 1.4 10*3/uL — ABNORMAL HIGH (ref 0.1–1.0)
Monocytes Relative: 6 %
Monocytes Relative: 6 %
Neutro Abs: 15.9 10*3/uL — ABNORMAL HIGH (ref 1.7–7.7)
Neutro Abs: 18.7 10*3/uL — ABNORMAL HIGH (ref 1.7–7.7)
Neutrophils Relative %: 76 %
Neutrophils Relative %: 75 %
Platelets: 191 10*3/uL (ref 150–400)
Platelets: 208 10*3/uL (ref 150–400)
RBC: 2.87 MIL/uL — ABNORMAL LOW (ref 4.22–5.81)
RBC: 3.02 MIL/uL — ABNORMAL LOW (ref 4.22–5.81)
RDW: 15.8 % — ABNORMAL HIGH (ref 11.5–15.5)
RDW: 15.9 % — ABNORMAL HIGH (ref 11.5–15.5)
WBC: 20.7 10*3/uL — ABNORMAL HIGH (ref 4.0–10.5)
WBC: 24.7 10*3/uL — ABNORMAL HIGH (ref 4.0–10.5)
nRBC: 0 % (ref 0.0–0.2)
nRBC: 0 % (ref 0.0–0.2)

## 2023-07-22 LAB — BASIC METABOLIC PANEL
Anion gap: 8 (ref 5–15)
BUN: 46 mg/dL — ABNORMAL HIGH (ref 8–23)
CO2: 18 mmol/L — ABNORMAL LOW (ref 22–32)
Calcium: 7.4 mg/dL — ABNORMAL LOW (ref 8.9–10.3)
Chloride: 106 mmol/L (ref 98–111)
Creatinine, Ser: 1 mg/dL (ref 0.61–1.24)
GFR, Estimated: >60 mL/min (ref >60)
Glucose, Bld: 99 mg/dL (ref 70–99)
Potassium: 3.9 mmol/L (ref 3.5–5.1)
Sodium: 132 mmol/L — ABNORMAL LOW (ref 135–145)

## 2023-07-22 MED ORDER — HEPARIN (PORCINE) 25000 UT/250ML-% IV SOLN: 1600.0000 [IU]/h | INTRAVENOUS | Status: DC

## 2023-07-22 MED ORDER — ENSURE ENLIVE PO LIQD
237.0000 mL | Freq: Two times a day (BID) | ORAL | Status: DC
  Administered 2023-07-22 – 2023-07-25 (×5): 237 mL via ORAL

## 2023-07-22 MED ORDER — ASPIRIN 81 MG PO TBEC
81.0000 mg | DELAYED_RELEASE_TABLET | Freq: Every day | ORAL | Status: DC
  Administered 2023-07-22 – 2023-07-25 (×4): 81 mg via ORAL
  Filled 2023-07-22 (×4): qty 1

## 2023-07-22 NOTE — Plan of Care (Signed)
  Problem: Education: Goal: Knowledge of General Education information will improve Description: Including pain rating scale, medication(s)/side effects and non-pharmacologic comfort measures Outcome: Progressing   Problem: Clinical Measurements: Goal: Will remain free from infection Outcome: Progressing   Problem: Nutrition: Goal: Adequate nutrition will be maintained Outcome: Progressing   Problem: Coping: Goal: Level of anxiety will decrease Outcome: Progressing   Problem: Elimination: Goal: Will not experience complications related to bowel motility Outcome: Progressing Goal: Will not experience complications related to urinary retention Outcome: Progressing   Problem: Pain Managment: Goal: General experience of comfort will improve and/or be controlled Outcome: Progressing   Problem: Safety: Goal: Ability to remain free from injury will improve Outcome: Progressing   Problem: Skin Integrity: Goal: Risk for impaired skin integrity will decrease Outcome: Progressing

## 2023-07-22 NOTE — Evaluation (Signed)
 Physical Therapy Evaluation Patient Details Name: Steven Jacobson MRN: [id number] DOB: [birth year] Today's Date: 07/22/2023  History of Present Illness  89 y/o male who presents on 07/18/23 with nausea, vomiting, cough and weakness. Admitted for Severe sepsis due to CAP as well as an extensive nonocclusive thrombus of the portal and superior mesenteric veins and a L renal mass. PMH of HTN, HLD, CAD, dCHF, gout, BPH, SVT, UTI, obesity.  Clinical Impression  Patient admitted with the above. PTA, patient lives with son and reports independence with no AD at baseline. Patient has hx of cognitive impairment and is A&Ox3 during session. Patient presents with weakness, impaired balance, impaired cognition, and decreased activity tolerance. Required modA for bed mobility with HOB elevated. Stood from slightly elevated bed surface with minA and HHA. Stood for x5 minutes for pericare and linen change. Able to take sidesteps at EOB with minA+2 and HHAx2. Patient will benefit from skilled PT services during acute stay to address listed deficits. Patient will benefit from ongoing therapy at discharge to maximize functional independence and safety.         If plan is discharge home, recommend the following: A lot of help with walking and/or transfers;A lot of help with bathing/dressing/bathroom;Assistance with cooking/housework;Assist for transportation;Help with stairs or ramp for entrance;Direct supervision/assist for financial management;Direct supervision/assist for medications management;Supervision due to cognitive status   Can travel by private vehicle   Yes    Equipment Recommendations Rolling Mcguire Gasparyan (2 wheels)  Recommendations for Other Services       Functional Status Assessment Patient has had a recent decline in their functional status and demonstrates the ability to make significant improvements in function in a reasonable and predictable amount of time.     Precautions / Restrictions  Precautions Precautions: Fall Recall of Precautions/Restrictions: Impaired Restrictions Weight Bearing Restrictions Per Provider Order: No      Mobility  Bed Mobility Overal bed mobility: Needs Assistance Bed Mobility: Supine to Sit, Sit to Supine     Supine to sit: Mod assist, HOB elevated Sit to supine: Contact guard assist        Transfers Overall transfer level: Needs assistance Equipment used: 1 person hand held assist Transfers: Sit to/from Stand Sit to Stand: Min assist, From elevated surface           General transfer comment: assist to stand from slightly elevated bed surface. Able to stand x 5 minutes for pericare and linen change. Took sidesteps towards HOB with minA+2 and HHAx2    Ambulation/Gait                  Stairs            Wheelchair Mobility     Tilt Bed    Modified Rankin (Stroke Patients Only)       Balance Overall balance assessment: Needs assistance Sitting-balance support: Feet supported, Single extremity supported Sitting balance-Leahy Scale: Fair     Standing balance support: During functional activity, Bilateral upper extremity supported, Single extremity supported Standing balance-Leahy Scale: Poor                               Pertinent Vitals/Pain Pain Assessment Pain Assessment: No/denies pain    Home Living Family/patient expects to be discharged to:: Private residence Living Arrangements: Children Available Help at Discharge: Family;Available PRN/intermittently Type of Home: House Home Access: Stairs to enter Entrance Stairs-Rails: Right;Left;Can reach both Entrance Stairs-Number of Steps: 3  Home Layout: One level Home Equipment: Grab bars - tub/shower;Cane - single Librarian, academic (2 wheels) Additional Comments: Pt lives with son who works during the day    Prior Function Prior Level of Function : Independent/Modified Independent;Driving             Mobility Comments:  IND amb without AD; no falls ADLs Comments: IND with ADLs, IADLs, driving     Extremity/Trunk Assessment   Upper Extremity Assessment Upper Extremity Assessment: Defer to OT evaluation    Lower Extremity Assessment Lower Extremity Assessment: Generalized weakness    Cervical / Trunk Assessment Cervical / Trunk Assessment: Kyphotic  Communication   Communication Factors Affecting Communication: Hearing impaired    Cognition Arousal: Alert Behavior During Therapy: Impulsive   PT - Cognitive impairments: History of cognitive impairments                         Following commands: Impaired Following commands impaired: Follows one step commands with increased time     Cueing Cueing Techniques: Verbal cues, Tactile cues, Gestural cues, Visual cues     General Comments General comments (skin integrity, edema, etc.): HR up to 117 on tele with return to bed; up to 105 seated EOB    Exercises     Assessment/Plan    PT Assessment Patient needs continued PT services  PT Problem List Decreased strength;Decreased activity tolerance;Decreased balance;Decreased mobility;Decreased cognition;Decreased knowledge of use of DME;Decreased safety awareness;Cardiopulmonary status limiting activity       PT Treatment Interventions DME instruction;Gait training;Therapeutic exercise;Functional mobility training;Therapeutic activities;Neuromuscular re-education;Balance training;Patient/family education    PT Goals (Current goals can be found in the Care Plan section)  Acute Rehab PT Goals Patient Stated Goal: did not state PT Goal Formulation: With family Time For Goal Achievement: 08/05/23 Potential to Achieve Goals: Fair    Frequency Min 2X/week     Co-evaluation PT/OT/SLP Co-Evaluation/Treatment: Yes Reason for Co-Treatment: For patient/therapist safety PT goals addressed during session: Mobility/safety with mobility OT goals addressed during session: ADL's and  self-care       AM-PAC PT "6 Clicks" Mobility  Outcome Measure Help needed turning from your back to your side while in a flat bed without using bedrails?: A Little Help needed moving from lying on your back to sitting on the side of a flat bed without using bedrails?: A Lot Help needed moving to and from a bed to a chair (including a wheelchair)?: A Lot Help needed standing up from a chair using your arms (e.g., wheelchair or bedside chair)?: A Lot Help needed to walk in hospital room?: A Lot Help needed climbing 3-5 steps with a railing? : A Lot 6 Click Score: 13    End of Session   Activity Tolerance: Patient tolerated treatment well Patient left: in bed;with call bell/phone within reach;with bed alarm set;with family/visitor present Nurse Communication: Mobility status PT Visit Diagnosis: Unsteadiness on feet (R26.81);Muscle weakness (generalized) (M62.81);Other abnormalities of gait and mobility (R26.89)    Time: 1610-9604 PT Time Calculation (min) (ACUTE ONLY): 24 min   Charges:   PT Evaluation $PT Eval Moderate Complexity: 1 Mod   PT General Charges $$ ACUTE PT VISIT: 1 Visit         Maylon Peppers, PT, DPT Physical Therapist - [facility]  [facility]   Shyana Kulakowski A Tarris Delbene 07/22/2023, 4:01 PM

## 2023-07-22 NOTE — Consult Note (Signed)
 Consultation Note Date: 07/22/2023   Patient Name: Steven Jacobson  DOB: [birth year]  MRN: [id number]  Age / Sex: 89 y.o., male  [facility]P: [facility] Referring Physician: Jonah Blue, MD  Reason for Consultation: Establishing goals of care  HPI/Patient Profile: From H&P Mavryk Pino is a 89 y.o. male with medical history significant of HTN, HLD, CAD, dCHF, gout, BPH, SVT, UTI, obesity, who presents with nausea, vomiting cough.   Clinical Assessment and Goals of Care: Notes and labs reviewed.  In to see patient.  He is currently sitting in bed with his 2 sons at bedside.  Patient is able to tell me the minute his bedside are his sons.  Patient states intermittently during my visit "do which you have to do".  He also states "I am not going to rehab".  Sons have been kept well updated and are aware of his pneumonia, UTI, liver and thyroid mass, mesenteric thrombosis with bleeding upon initiation of heparin.  We discussed his diagnoses, prognosis, GOC, EOL wishes disposition and options.  Created space and opportunity for patient  to explore thoughts and feelings regarding current medical information.   A detailed discussion was had today regarding advanced directives.  Concepts specific to code status, artifical feeding and hydration, IV antibiotics and rehospitalization were discussed.  The difference between an aggressive medical intervention path and a comfort care path was discussed.  Values and goals of care important to patient and family were attempted to be elicited.  Discussed limitations of medical interventions to prolong quality of life in some situations and discussed the concept of human mortality.  Son confirms DNR/DNI.  The sons state they would like to reattempt heparin therapy.  They state if he is able to transition to an oral medication such as Eliquis, they would be amenable to this.   They state they would not want Lovenox or heparin injections.  Given his dementia and other health issues, continuing conversation about hospice care even with anticoagulation.  SUMMARY OF RECOMMENDATIONS   Reattempt heparin therapy.  If patient able to tolerate, family would be amenable to oral anticoagulation, no Lovenox or heparin injections.  Attending updated.  PMT will follow.  Prognosis:  Poor       Primary Diagnoses: Present on Admission:  Superior mesenteric vein thrombosis (HCC)  Multifocal pneumonia  Acute metabolic encephalopathy  Sepsis due to pneumonia [facility])  Renal mass  Thyroid nodule  CAD (coronary artery disease)  Essential hypertension  Obesity (BMI 30-39.9)  Chronic diastolic CHF (congestive heart failure) (HCC)  Portal vein thrombosis  AKI (acute kidney injury) (HCC)  SVT (supraventricular tachycardia) (HCC)   I have reviewed the medical record, interviewed the patient and family, and examined the patient. The following aspects are pertinent.  Past Medical History:  Diagnosis Date   CAD (coronary artery disease)    Hypertension    Social History   Socioeconomic History   Marital status: Widowed    Spouse name: Not on file   Number of children: Not on file  Years of education: Not on file   Highest education level: Bachelor's degree (e.g., BA, AB, BS)  Occupational History   Not on file  Tobacco Use   Smoking status: Never   Smokeless tobacco: Never  Vaping Use   Vaping status: Never Used  Substance and Sexual Activity   Alcohol use: Never   Drug use: Never   Sexual activity: Not on file  Other Topics Concern   Not on file  Social History Narrative   Not on file   Social Drivers of Health   Financial Resource Strain: Low Risk  (07/17/2023)   Overall Financial Resource Strain (CARDIA)    Difficulty of Paying Living Expenses: Not hard at all  Food Insecurity: No Food Insecurity (07/19/2023)   Hunger Vital Sign    Worried About  Running Out of Food in the Last Year: Never true    Ran Out of Food in the Last Year: Never true  Transportation Needs: Unmet Transportation Needs (07/19/2023)   PRAPARE - Administrator, Civil Service (Medical): Yes    Lack of Transportation (Non-Medical): No  Physical Activity: Unknown (07/17/2023)   Exercise Vital Sign    Days of Exercise per Week: 0 days    Minutes of Exercise per Session: Not on file  Stress: Stress Concern Present (07/17/2023)   [employer] of Occupational Health - Occupational Stress Questionnaire    Feeling of Stress : To some extent  Social Connections: Socially Isolated (07/19/2023)   Social Connection and Isolation Panel [NHANES]    Frequency of Communication with Friends and Family: Three times a week    Frequency of Social Gatherings with Friends and Family: Once a week    Attends Religious Services: Never    Database administrator or Organizations: No    Attends Banker Meetings: Never    Marital Status: Widowed   History reviewed. No pertinent family history. Scheduled Meds:  allopurinol  200 mg Oral Daily   atorvastatin  40 mg Oral QHS   doxycycline  100 mg Oral Q12H   feeding supplement  237 mL Oral BID BM   latanoprost  1 drop Both Eyes QHS   Continuous Infusions:  cefTRIAXone (ROCEPHIN)  IV 100 mL/hr at 07/21/23 1816   PRN Meds:.acetaminophen, albuterol, dextromethorphan-guaiFENesin, diphenhydrAMINE, metoprolol tartrate, oxyCODONE Medications Prior to Admission:  Prior to Admission medications   Medication Sig Start Date End Date Taking? Authorizing Provider  allopurinol (ZYLOPRIM) 100 MG tablet Take 200 mg by mouth daily. To lower uric acid levels and prevent gout 11/23/20  Yes [provider]  atorvastatin (LIPITOR) 80 MG tablet Take 40 mg by mouth at bedtime. 11/23/20  Yes [provider]  clopidogrel (PLAVIX) 75 MG tablet Take 75 mg by mouth daily.   Yes [provider]  latanoprost  (XALATAN) 0.005 % ophthalmic solution Place 1 drop into both eyes at bedtime. 10/18/20  Yes [provider]  lisinopril (ZESTRIL) 40 MG tablet Take 40 mg by mouth daily. 11/23/20  Yes [provider]  metoprolol tartrate (LOPRESSOR) 100 MG tablet Take 100 mg by mouth every 12 (twelve) hours. 11/23/20  Yes [provider]  aspirin 81 MG chewable tablet Chew 81 mg by mouth daily. Patient not taking: Reported on 07/18/2023 10/05/06   [provider]   Allergies  Allergen Reactions   Simvastatin Other (See Comments)    Muscle pain   Niacin Other (See Comments) and Rash   Review of Systems  All other  systems reviewed and are negative.   Physical Exam Pulmonary:     Effort: Pulmonary effort is normal.  Neurological:     Mental Status: He is alert.     Vital Signs: BP (!) 124/58 (BP Location: Right Arm)   Pulse 95   Temp 97.9 F (36.6 C)   Resp 18   Ht 6\' 2"  (1.88 m)   Wt 102.7 kg   SpO2 96%   BMI 29.07 kg/m  Pain Scale: 0-10   Pain Score: 0-No pain   SpO2: SpO2: 96 % O2 Device:SpO2: 96 % O2 Flow Rate: .O2 Flow Rate (L/min): 2 L/min  IO: Intake/output summary:  Intake/Output Summary (Last 24 hours) at 07/22/2023 1537 Last data filed at 07/22/2023 1412 Gross per 24 hour  Intake 778.99 ml  Output --  Net 778.99 ml    LBM: Last BM Date : 07/21/23 Baseline Weight: Weight: 107.8 kg Most recent weight: Weight: 102.7 kg       Signed by: Morton Stall, NP   Please contact Palliative Medicine Team phone at [phone] for questions and concerns.  For individual provider: See Loretha Stapler

## 2023-07-22 NOTE — Consult Note (Signed)
 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   Allergies  Allergen Reactions   Simvastatin Other (See Comments)    Muscle pain   Niacin Other (See Comments) and Rash    Patient Measurements: Height: 6\' 2"  (188 cm) Weight: 102.7 kg (226 lb 6.6 oz) IBW/kg (Calculated) : 82.2 Heparin Dosing Weight: 102 kg  Vital Signs: Temp: 97.9 F (36.6 C) (03/04 1532) BP: 124/58 (03/04 1532) Pulse Rate: 95 (03/04 1532)  Labs: Recent Labs    07/20/23 1014 07/20/23 1932 07/20/23 2342 07/21/23 0524 07/21/23 1817 07/22/23 0455  HGB  --  10.2*   < > 8.9* 8.8* 8.4*  HCT  --  30.9*   < > 26.8* 26.8* 25.3*  PLT  --  212   < > 189 199 191  HEPARINUNFRC 0.48 0.29*  --  <0.10*  --   --   CREATININE 1.18  --   --  1.07  --  1.00   < > = values in this interval not displayed.    Estimated Creatinine Clearance: 66.5 mL/min (by C-G formula based on SCr of 1 mg/dL).   Medical History: Past Medical History:  Diagnosis Date   CAD (coronary artery disease)    Hypertension     Medications:  NO AC prior to admission. Received Rx from [place]  Assessment: 89 y.o. male with history of HTN, HLD presenting today for weakness. 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 Pharmacy consulted to initiate and manage heparin infusion.  Baseline Hgb and plt appropriate to  initiate heparin therapy.   Date Time HL Rate/Comment    Goal of Therapy:  Heparin level 0.3-0.5 units/ml Monitor platelets by anticoagulation protocol: Yes   Plan:  Provider requesting no bolus and narrow therapeutic range due to bleeding previously while on heparin  Start heparin infusion at 1600 units/hr Check heparin level 8 hours after infusion started Daily CBC while on heparin Monitor closely for s/sx of bleeding  Thank you for involving pharmacy in this patient's care.   Barrie Folk,  PharmD Clinical Pharmacist 07/22/2023 3:37 PM

## 2023-07-22 NOTE — Progress Notes (Signed)
 Addendum:  Heparin was ordered after palliative care discussed with family.  However, the nurse just contacted me to let me know that he is continuing to have bloody stools.  Will hold heparin, resume ASA 81 mg daily.  Will need to have ongoing GOC conversations with family.  Will order CBC q12 with plan to transfuse for Hgb <7.  Georgana Curio, M.D.

## 2023-07-22 NOTE — Progress Notes (Addendum)
 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 He was started on Ceftriaxone and doxycycline with improvement in sepsis physiology. He was also started on heparin infusion with vascular surgery consultation (no other recommendations).  Oncology will see in short-term follow up after discharge.  Assessment and Plan:  Severe sepsis due to CAP Patient met criteria for severe sepsis with WBC 29.9, heart rate up to 120s, RR 26, lactic acid 2.3 --> 2.0; also with altered mental status and AKI IV Rocephin and doxycycline (patient received 1 dose of vancomycin and Flagyl in ED) Incentive spirometry, Mucinex, Bronchodilators O2 requirement has resolved Blood cultures negative x 4 days Sputum cultures with yeast - but he has improved without antifungal treatment so likely contaminant PT/OT evaluations for possible placement   Superior mesenteric vein thrombosis/portal vein thrombosis Consulted Dr. Evie Lacks of [facility] who recommended IV heparin and supportive care, serial exams; vascular surgery signed off Started on heparin but he developed GI/GU bleeding With renal mass and large thyroid nodule, possible hypercoagulable associated with underlying malignancy? - Dr. Alena Bills will arrange for outpatient cancer center appointment Vascular reconsulted for assistance and recognizes the challenge of the situation - intervention is not possible and yet he is currently unable to tolerate [facility] Added ASA 81 mg daily with improvement in bleeding, could consider and monitor with known high risk of complications associated with  thrombsis Will re-trial heparin low-dose and without bolus - but if he re-bleeds, he will need hospice Given this tenuous situation, palliative care has been consulted If he tolerates, can transition to Eliquis Family prefers no Lovenox/heparin injections, per palliative care   Diarrhea Resolved with cessation of heparin, likely related to GI bleeding   ABLA On Heparin, patient started with GU/GI bleeding Heparin stopped Hgb is slightly lower today, 8.4 down from 8.8, down from 12.6 on 3/1 Will recheck in AM (sooner if recurrent bleeding occurs)   Dementia Concern for acute encephalopathy on presentation but instead based on history and evaluation of patient, this is more likely related to dementia CT head negative Fall precautions Delirium precautions He is likely to benefit from SNF rehab but may also need LTC unless family is able to help   CAD (coronary artery disease) Continue Aspirin, Lipitor   Essential hypertension Holding blood pressure medications currently including HCTZ, metoprolol, lisinopril due to severe sepsis and soft blood pressure BPs 112/62-127/67 today   Chronic diastolic CHF (congestive heart failure) 2D echo 04/13/2021 showed EF of 50-55% with grade 1 diastolic dysfunction CHF seems to be compensated   AKI (acute kidney injury) Likely due to dehydration and continuation of diuretics and lisinopril in the setting of severe sepsis Hyponatremia may be related to this but is stable and not clinically significant Hold HCTZ and lisinopril Resolved, back to baseline   Renal mass and  thyroid nodule Incidental findings by CT scan Need to follow-up with PCP as outpatient   History of SVT (supraventricular tachycardia)  EKG showed a wide-complex tachycardia As needed IV metoprolol 2.5 mg every 2 hour for heart rate> 125   Pressure ulcer Pressure  Injury 07/20/23 Buttocks Right Stage 2 -  Partial thickness loss of dermis presenting as a shallow open injury  with a red, pink wound bed without slough. on right side of gluteal cleft (Active)  07/20/23 1210  Location: Buttocks  Location Orientation: Right  Staging: Stage 2 -  Partial thickness loss of dermis presenting as a shallow open injury with a red, pink wound bed without slough.  Wound Description (Comments): on right side of gluteal cleft  Present on Admission: -- (unknown)    Obesity (BMI 30-39.9) Body mass index is 30.51 kg/m.Marland Kitchen  Weight loss should be encouraged Outpatient PCP/bariatric medicine f/u encouraged    DNR Discussed with patient  He prefers to "die naturally" should that situation arise Code status changed Son agrees that this is likely consistent with his wishes       Consultants: Vascular surgery Palliative care RT SLP PT OT TOC team   Procedures: None   Antibiotics: Ceftriaxone 2/28- Doxycycline 2/28- Vancomycin x 1 Metronidazole x 1  30 Day Unplanned Readmission Risk Score    Flowsheet Row ED to Hosp-Admission (Current) from 07/18/2023 in [facility] 1C MEDICAL TELEMETRY  30 Day Unplanned Readmission Risk Score (%) 15.12 Filed at 07/22/2023 0801       This score is the patient's risk of an unplanned readmission within 30 days of being discharged (0 -100%). The score is based on dignosis, age, lab data, medications, orders, and past utilization.   Low:  0-14.9   Medium: 15-21.9   High: 22-29.9   Extreme: 30 and above           Subjective: Patient feels good, no complaints.  Son reports perseverative speech, confusion is somewhat worse today.   Objective: Vitals:   07/22/23 0804 07/22/23 1157  BP: 126/67 112/62  Pulse: 85 100  Resp: 20 18  Temp: 97.7 F (36.5 C) 97.6 F (36.4 C)  SpO2: 97% 98%    Intake/Output Summary (Last 24 hours) at 07/22/2023 1333 Last data filed at 07/22/2023 1015 Gross per 24 hour  Intake 718.99 ml  Output --  Net 718.99 ml   Filed Weights   07/18/23 1954 07/20/23 0516  Weight: 107.8  kg 102.7 kg    Exam:  General:  Appears calm and comfortable and is in NAD, on RA Eyes:  EOMI, normal lids, iris ENT:  grossly normal hearing, lips & tongue, mmm Cardiovascular:  RRR, no m/r/g. No LE edema.  Respiratory:   CTAB.  Normal respiratory effort.  On [place] O2 Abdomen:  soft, NT, ND Skin:  no rash or induration seen on limited exam Musculoskeletal:  grossly normal tone BUE/BLE, good ROM, no bony abnormality Psychiatric:  blunted mood and affect, speech fluent and appropriate, AOx3 Neurologic:  CN 2-12 grossly intact, moves all extremities in coordinated fashion  Data Reviewed: I have reviewed the patient's lab results since admission.  Pertinent labs for today include:   Na++ 132 CO2 18, stable BUN 46/Creatinine 1/GFR >60, stable WBC 20.7, improved Hgb 8.4, down from 8.8 at 1817 3/3     Family Communication: Son was present throughout evaluation  Disposition: Status is: Inpatient Remains inpatient appropriate because: ongoing management     Time spent: 50 minutes  Unresulted Labs (From admission, onward)     Start     Ordered   07/23/23 0500  CBC with Differential/Platelet  Tomorrow morning,   R       Question:  Specimen collection method  Answer:  Lab=Lab collect  07/22/23 1329   07/23/23 0500  Basic metabolic panel  Tomorrow morning,   R       Question:  Specimen collection method  Answer:  Lab=Lab collect   07/22/23 1329             Author: Jonah Blue, MD 07/22/2023 1:33 PM  For on call review [email or web address].

## 2023-07-22 NOTE — Evaluation (Signed)
 Clinical/Bedside Swallow Evaluation Patient Details  Name: Steven Jacobson MRN: [id number] Date of Birth: [birth year]  Today's Date: 07/22/2023 Time: SLP Start Time (ACUTE ONLY): 0850 SLP Stop Time (ACUTE ONLY): 0935 SLP Time Calculation (min) (ACUTE ONLY): 45 min  Past Medical History:  Past Medical History:  Diagnosis Date   CAD (coronary artery disease)    Hypertension    Past Surgical History:  Past Surgical History:  Procedure Laterality Date   LEFT HEART CATH AND CORS/GRAFTS ANGIOGRAPHY N/A 04/13/2021   Procedure: LEFT HEART CATH AND CORS/GRAFTS ANGIOGRAPHY;  Surgeon: Yvonne Kendall, MD;  Location: [facility] INVASIVE CV LAB;  Service: Cardiovascular;  Laterality: N/A;   HPI:  Pt is an 89 y/o male who presents on 07/18/23 with nausea, vomiting, cough and weakness. Admitted for Severe sepsis due to CAP as well as an extensive nonocclusive thrombus of the portal and superior mesenteric veins and a L renal mass. PMH of HTN, Falls, HLD, CAD, dCHF, gout, BPH, SVT, UTI, obesity. Son stated he is not aware of pt having any swallowing issues at home. Pt lives w/ a Son.   CT of Chest: Lungs/Pleura: Bilateral lower lobes patchy heterogeneous  nonenhancing airspace opacities. Left lower lobe peribronchovascular  patchy airspace opacities. Bronchial wall thickening. No pulmonary  nodule. No pulmonary mass. No pleural effusion. OF NOTE: Pt had been lying in a recliner for several days w/ N/V episodes prior to admit.   Head CT on 06/22/23: No acute intracranial abnormalities.  2. Moderate cerebral and mild cerebellar atrophy with Extensive  chronic microvascular ischemic changes in the cerebral white matter.  Unsure of pt's Baseline Cognitive functioning but "Dementia" was a topic in a chart note in 2022 s/p increased Falls.     Assessment / Plan / Recommendation  Clinical Impression   Pt seen for BSE this morning after reported coughing when drinking liquids. Pt awake, sitting in bed drinking Ensure via straw.  Son present. Pt was verbal and followed 1step commands w/ cues; suspect Cognitive decline per some of his engagement/responses. Noted previous Head CT in chart.  On RA, afebrile.   Pt appears to present w/ functional oropharyngeal phase swallow w/ No overt oropharyngeal phase dysphagia noted when following general aspiration precautions; No overt neuromuscular deficits noted. Pt consumed the po trials w/ No overt, clinical s/s of aspiration during po trials.  Pt appears at reduced risk for aspiration when following general aspiration precautions w/ support and Supervision for follow through w/ precautions -- unsure of pt's Baseline Cognitive functioning re: full insight and awareness. Pt has challenging factors that could impact oropharyngeal swallowing to include illness/hospitalization, deconditioning/weakness, and advanced age as well as suspected Cognitive decline. These factors can increase risk for dysphagia as well as decreased oral intake overall.   During po trials, pt consumed consistencies/trials(~8-10ozs of liquids) w/ no overt coughing, decline in vocal quality, or change in respiratory presentation during/post trials. No decline in O2 sats during. Oral phase appeared grossly [facility] w/ timely bolus management and control of bolus propulsion for A-P transfer for swallowing. Oral clearing achieved w/ all trial consistencies. Informal OM Exam appeared [facility] w/ no unilateral weakness noted. Speech Clear. Pt fed self w/ setup support.   Recommend continue a Regular consistency diet w/ well-Cut meats, moistened foods; Thin liquids -- carefully monitor straw use, and pt should Hold Cup when drinking. Recommend general aspiration precautions, reduce distractions/talking during meals. Small, Single sips SLOWLY. Sit fully upright for oral intake -- in chair best. Pills WHOLE in Puree for  safer, easier swallowing - it was encouraged now and for D/C to the Son/pt.  Education given on Pills in Puree; food  consistencies and easy to eat options; general aspiration precautions to pt and Son; handout given. MD/NSG to reconsult if any new needs arise while admitted. NSG updated, agreed. MD updated. Recommend Dietician and Palliative Care f/u for support. SLP Visit Diagnosis: Dysphagia, unspecified (R13.10) (suspect close to/at his Baseline)    Aspiration Risk  Mild aspiration risk;Risk for inadequate nutrition/hydration (reduced following general aspiration precautions)    Diet Recommendation   Thin;Age appropriate regular (cut meats, moistened foods) - a Regular consistency diet w/ well-Cut meats, moistened foods; Thin liquids -- carefully monitor straw use, and pt should Hold Cup when drinking. Recommend general aspiration precautions, reduce distractions/talking during meals. Small, Single sips SLOWLY. Sit fully upright for oral intake -- in chair best.   Medication Administration: Whole meds with puree (for safer swallowing)    Other  Recommendations Recommended Consults:  (Palliative Care consult for GOC) Oral Care Recommendations: Oral care BID;Oral care before and after PO;Staff/trained caregiver to provide oral care    Recommendations for follow up therapy are one component of a multi-disciplinary discharge planning process, led by the attending physician.  Recommendations may be updated based on patient status, additional functional criteria and insurance authorization.  Follow up Recommendations No SLP follow up      Assistance Recommended at Discharge  Intermittent  Functional Status Assessment Patient has not had a recent decline in their functional status  Frequency and Duration  (n/a)   (n/a)       Prognosis Prognosis for improved oropharyngeal function: Good Barriers to Reach Goals: Cognitive deficits (Advanced Age)      Swallow Study   General Date of Onset: 07/18/23 HPI: Pt is an 89 y/o male who presents on 07/18/23 with nausea, vomiting, cough and weakness. Admitted for  Severe sepsis due to CAP as well as an extensive nonocclusive thrombus of the portal and superior mesenteric veins and a L renal mass. PMH of HTN, Falls, HLD, CAD, dCHF, gout, BPH, SVT, UTI, obesity. Son stated he is not aware of pt having any swallowing issues at home. Pt lives w/ a Son.  CT of Chest: Lungs/Pleura: Bilateral lower lobes patchy heterogeneous  nonenhancing airspace opacities. Left lower lobe peribronchovascular  patchy airspace opacities. Bronchial wall thickening. No pulmonary  nodule. No pulmonary mass. No pleural effusion. OF NOTE: Pt had been lying in a recliner for several days w/ N/V episodes prior to admit.  Head CT on 06/22/23: No acute intracranial abnormalities.  2. Moderate cerebral and mild cerebellar atrophy with Extensive  chronic microvascular ischemic changes in the cerebral white matter.  Unsure of pt's Baseline Cognitive functioning but "Dementia" was a topic in a chart note in 2022 s/p increased Falls. Type of Study: Bedside Swallow Evaluation Previous Swallow Assessment: none Diet Prior to this Study: Regular;Thin liquids (Level 0) Temperature Spikes Noted: No (wbc trending down) Respiratory Status: Room air History of Recent Intubation: No Behavior/Cognition: Alert;Cooperative;Pleasant mood;Distractible;Requires cueing (seemed concrete in his engagement; min impulsive) Oral Cavity Assessment: Within Functional Limits Oral Care Completed by SLP: Recent completion by staff Oral Cavity - Dentition: Adequate natural dentition Vision: Functional for self-feeding Self-Feeding Abilities: Able to feed self;Needs set up Patient Positioning: Upright in bed (MOD support for upright sitting) Baseline Vocal Quality: Normal Volitional Cough: Strong Volitional Swallow: Able to elicit    Oral/Motor/Sensory Function Overall Oral Motor/Sensory Function: Within functional limits   Ice  Chips Ice chips: Not tested   Thin Liquid Thin Liquid: Within functional limits Presentation:  Self Fed;Straw (~8-10 ozs) Other Comments: ensure, water    Nectar Thick Nectar Thick Liquid: Not tested   Honey Thick Honey Thick Liquid: Not tested   Puree Puree: Within functional limits Presentation: Self Fed;Spoon (3-4 trials)   Solid     Solid: Not tested Other Comments: had just eaten        Jerilynn Som, MS, CCC-SLP Speech Language Pathologist Rehab Services; [facility] [phone] (ascom) Edward Guthmiller 07/22/2023,4:51 PM

## 2023-07-22 NOTE — Evaluation (Signed)
 Occupational Therapy Evaluation Patient Details Name: Steven Jacobson MRN: [id number] DOB: 28-Dec-1933 Today's Date: 07/22/2023   History of Present Illness   Pt is an 89 y.o. male  who presents with nausea, vomiting, cough and weakness. Admitted for Severe sepsis due to CAP as well as an extensive nonocclusive thrombus of the portal and superior mesenteric veins and a L renal mass. PMH of HTN, HLD, CAD, dCHF, gout, BPH, SVT, UTI, obesity.     Clinical Impressions Pt was seen for PT/OT co-evaluation this date to maximize pt/therapist safety. PTA, pt was living at home with his son who works during the day. Pt was IND with mobility without AD use and IND with ADL performance until recently.  Pt presents to acute OT demonstrating impaired ADL performance and functional mobility 2/2 weakness, low activity tolerance, and balance deficits. Pt currently requires Mod A for bed mobility to reach EOB, CGA to return to supine. Noted to have fully soiled bedding, rectal tube out, tele off and purewick off. Full linen change provided, nurse notified to assist with rectal tube. Min A x1 via HHA for STS from elevated bed height with total assist for peri-care and linen change. Pt tolerated standing ~5 mins with notable weakness. HR up to 117 with return to bed. Able to take lateral steps to [facility] with Min A x2 via HHA. Alert and oriented to place, self, but stated it was March 2013. Repeats things during session and asks what he can do next.  Pt would benefit from skilled OT services to address noted impairments and functional limitations (see below for any additional details) in order to maximize safety and independence while minimizing falls risk and caregiver burden. Do anticipate the need for follow up OT services upon acute hospital DC.      If plan is discharge home, recommend the following:   A lot of help with walking and/or transfers;Supervision due to cognitive status;Direct supervision/assist for  medications management;A lot of help with bathing/dressing/bathroom;Direct supervision/assist for financial management;Assist for transportation;Assistance with cooking/housework;Help with stairs or ramp for entrance     Functional Status Assessment   Patient has had a recent decline in their functional status and demonstrates the ability to make significant improvements in function in a reasonable and predictable amount of time.     Equipment Recommendations   Other (comment) (defer)     Recommendations for Other Services         Precautions/Restrictions   Precautions Precautions: Fall Restrictions Weight Bearing Restrictions Per Provider Order: No     Mobility Bed Mobility Overal bed mobility: Needs Assistance Bed Mobility: Supine to Sit, Sit to Supine     Supine to sit: Mod assist, HOB elevated, Used rails Sit to supine: Contact guard assist, Used rails   General bed mobility comments: Mod A for trunkal elevation to reach EOB, able to return with CGA    Transfers Overall transfer level: Needs assistance Equipment used: 1 person hand held assist Transfers: Sit to/from Stand Sit to Stand: From elevated surface, Min assist           General transfer comment: Min A from elevated bed surface for STS with HHAx1; OT provided linen change and peri-care with total assist while in standing; pt progressed to lateral steps with HHA x2/Min A      Balance Overall balance assessment: Needs assistance Sitting-balance support: Feet supported, Single extremity supported Sitting balance-Leahy Scale: Fair       Standing balance-Leahy Scale: Poor Standing balance comment: Min  A x2 for lateral steps via HHA; pt unsteady with high fall risk                           ADL either performed or assessed with clinical judgement   ADL Overall ADL's : Needs assistance/impaired                             Toileting- Clothing Manipulation and Hygiene:  Total assistance;Sit to/from stand Toileting - Clothing Manipulation Details (indicate cue type and reason): for peri-care             Vision         Perception         Praxis         Pertinent Vitals/Pain Pain Assessment Pain Assessment: No/denies pain     Extremity/Trunk Assessment Upper Extremity Assessment Upper Extremity Assessment: Generalized weakness   Lower Extremity Assessment Lower Extremity Assessment: Generalized weakness       Communication Communication Factors Affecting Communication: Hearing impaired   Cognition Arousal: Alert Behavior During Therapy: Impulsive Cognition: History of cognitive impairments, Cognition impaired   Orientation impairments: Time, Situation (stated March 2013) Awareness: Intellectual awareness impaired     Executive functioning impairment (select all impairments): Initiation, Sequencing, Reasoning, Problem solving                   Following commands: Impaired Following commands impaired: Follows one step commands with increased time     Cueing  General Comments   Cueing Techniques: Verbal cues;Tactile cues;Gestural cues;Visual cues  HR up to 117 on tele with return to bed; up to 105 seated EOB   Exercises Other Exercises Other Exercises: Edu on role of OT in acute setting, spoke with family regarding SNF recommendation.   Shoulder Instructions      Home Living Family/patient expects to be discharged to:: Private residence Living Arrangements: Children Available Help at Discharge: Family;Available PRN/intermittently Type of Home: House Home Access: Stairs to enter [employer] of Steps: 3 Entrance Stairs-Rails: Right;Left;Can reach both Home Layout: One level     Bathroom Shower/Tub: Walk-in shower         Home Equipment: Grab bars - tub/shower;Cane - single Librarian, academic (2 wheels)   Additional Comments: Pt lives with son who works during the day      Prior  Functioning/Environment Prior Level of Function : Independent/Modified Independent;Driving             Mobility Comments: IND amb without AD; no falls ADLs Comments: IND with ADLs, IADLs, driving    OT Problem List: Decreased strength;Decreased cognition;Decreased activity tolerance;Decreased safety awareness;Impaired balance (sitting and/or standing)   OT Treatment/Interventions: Self-care/ADL training;Therapeutic exercise;Therapeutic activities;DME and/or AE instruction;Patient/family education;Balance training      OT Goals(Current goals can be found in the care plan section)   Acute Rehab OT Goals Patient Stated Goal: improve strength OT Goal Formulation: With patient/family Time For Goal Achievement: 08/05/23 Potential to Achieve Goals: Fair ADL Goals Pt Will Perform Lower Body Bathing: with min assist;sitting/lateral leans;sit to/from stand Pt Will Perform Lower Body Dressing: with min assist;sitting/lateral leans;sit to/from stand Pt Will Transfer to Toilet: with min assist;ambulating;regular height toilet;bedside commode;grab bars   OT Frequency:  Min 2X/week    Co-evaluation PT/OT/SLP Co-Evaluation/Treatment: Yes Reason for Co-Treatment: For patient/therapist safety PT goals addressed during session: Mobility/safety with mobility OT goals addressed during session: ADL's and self-care  AM-PAC OT "6 Clicks" Daily Activity     Outcome Measure Help from another person eating meals?: None Help from another person taking care of personal grooming?: A Little Help from another person toileting, which includes using toliet, bedpan, or urinal?: A Lot Help from another person bathing (including washing, rinsing, drying)?: A Lot Help from another person to put on and taking off regular upper body clothing?: A Lot Help from another person to put on and taking off regular lower body clothing?: A Lot 6 Click Score: 15   End of Session Nurse Communication: Mobility  status  Activity Tolerance: Patient tolerated treatment well Patient left: in bed;with call bell/phone within reach;with bed alarm set;with family/visitor present  OT Visit Diagnosis: Other abnormalities of gait and mobility (R26.89);Unsteadiness on feet (R26.81);Muscle weakness (generalized) (M62.81)                Time: 4098-1191 OT Time Calculation (min): 24 min Charges:  OT General Charges $OT Visit: 1 Visit OT Evaluation $OT Eval Moderate Complexity: 1 Mod Cheris Tweten, OTR/L 07/22/23, 3:56 PM  Leeyah Heather E Thresia Ramanathan 07/22/2023, 3:49 PM

## 2023-07-23 DIAGNOSIS — Z789 Other specified health status: Secondary | ICD-10-CM

## 2023-07-23 DIAGNOSIS — Z7189 Other specified counseling: Secondary | ICD-10-CM | POA: Diagnosis not present

## 2023-07-23 DIAGNOSIS — J189 Pneumonia, unspecified organism: Secondary | ICD-10-CM | POA: Diagnosis not present

## 2023-07-23 LAB — BASIC METABOLIC PANEL
Anion gap: 5 (ref 5–15)
BUN: 31 mg/dL — ABNORMAL HIGH (ref 8–23)
CO2: 20 mmol/L — ABNORMAL LOW (ref 22–32)
Calcium: 7.5 mg/dL — ABNORMAL LOW (ref 8.9–10.3)
Chloride: 109 mmol/L (ref 98–111)
Creatinine, Ser: 0.92 mg/dL (ref 0.61–1.24)
GFR, Estimated: >60 mL/min (ref >60)
Glucose, Bld: 87 mg/dL (ref 70–99)
Potassium: 4.2 mmol/L (ref 3.5–5.1)
Sodium: 134 mmol/L — ABNORMAL LOW (ref 135–145)

## 2023-07-23 LAB — CULTURE, BLOOD (ROUTINE X 2)
Culture: NO GROWTH
Culture: NO GROWTH

## 2023-07-23 LAB — CBC WITH DIFFERENTIAL/PLATELET
Abs Immature Granulocytes: 0.9 10*3/uL — ABNORMAL HIGH (ref 0.00–0.07)
Basophils Absolute: 0.1 10*3/uL (ref 0.0–0.1)
Basophils Relative: 1 %
Eosinophils Absolute: 0.4 10*3/uL (ref 0.0–0.5)
Eosinophils Relative: 2 %
HCT: 26 % — ABNORMAL LOW (ref 39.0–52.0)
Hemoglobin: 8.7 g/dL — ABNORMAL LOW (ref 13.0–17.0)
Immature Granulocytes: 5 %
Lymphocytes Relative: 13 %
Lymphs Abs: 2.7 10*3/uL (ref 0.7–4.0)
MCH: 30.1 pg (ref 26.0–34.0)
MCHC: 33.5 g/dL (ref 30.0–36.0)
MCV: 90 fL (ref 80.0–100.0)
Monocytes Absolute: 1.2 10*3/uL — ABNORMAL HIGH (ref 0.1–1.0)
Monocytes Relative: 6 %
Neutro Abs: 14.8 10*3/uL — ABNORMAL HIGH (ref 1.7–7.7)
Neutrophils Relative %: 73 %
Platelets: 194 10*3/uL (ref 150–400)
RBC: 2.89 MIL/uL — ABNORMAL LOW (ref 4.22–5.81)
RDW: 16 % — ABNORMAL HIGH (ref 11.5–15.5)
WBC: 20 10*3/uL — ABNORMAL HIGH (ref 4.0–10.5)
nRBC: 0 % (ref 0.0–0.2)

## 2023-07-23 MED ORDER — HALOPERIDOL LACTATE 2 MG/ML PO CONC
5.0000 mg | Freq: Four times a day (QID) | ORAL | Status: DC | PRN
Start: 2023-07-23 — End: 2023-07-25

## 2023-07-23 MED ORDER — HALOPERIDOL 0.5 MG PO TABS
5.0000 mg | ORAL_TABLET | Freq: Four times a day (QID) | ORAL | Status: DC | PRN
Start: 2023-07-23 — End: 2023-07-25

## 2023-07-23 MED ORDER — OXYCODONE HCL 5 MG PO TABS: 5.0000 mg | ORAL_TABLET | ORAL | Status: DC | PRN

## 2023-07-23 MED ORDER — GERHARDT'S BUTT CREAM
TOPICAL_CREAM | Freq: Every day | CUTANEOUS | Status: DC
  Filled 2023-07-23: qty 60

## 2023-07-23 MED ORDER — HALOPERIDOL LACTATE 2 MG/ML PO CONC: 5.0000 mg | Freq: Four times a day (QID) | ORAL | Status: DC | PRN

## 2023-07-23 NOTE — TOC Progression Note (Signed)
 Transition of Care [facility]) - Progression Note    Patient Details  Name: Steven Jacobson MRN: [id number] Date of Birth: 21-Nov-1933  Transition of Care [facility]) CM/SW Contact  Erin Sons, [place] Phone Number: 07/23/2023, 11:12 AM  Clinical Narrative:     CSW notified by PMT that family interested in pursuing hospice facility. CSW met with pt's son Steven Jacobson outside of room who confirmed this plan. Son would like to pursue [facility] in [place]. CSW made referral to [facility]; they will review referral.              Social Determinants of Health (SDOH) Interventions SDOH Screenings   Food Insecurity: No Food Insecurity (07/19/2023)  Housing: Low Risk  (07/19/2023)  Transportation Needs: Unmet Transportation Needs (07/19/2023)  Utilities: Not At Risk (07/19/2023)  Financial Resource Strain: Low Risk  (07/17/2023)  Physical Activity: Unknown (07/17/2023)  Social Connections: Socially Isolated (07/19/2023)  Stress: Stress Concern Present (07/17/2023)  Tobacco Use: Low Risk  (07/18/2023)    Readmission Risk Interventions     No data to display

## 2023-07-23 NOTE — Progress Notes (Signed)
Per Dr Sreenath, dc tele monitoring  

## 2023-07-23 NOTE — Progress Notes (Signed)
 Daily Progress Note   Patient Name: Steven Jacobson       Date: 07/23/2023 DOB: 09-12-1933  Age: 89 y.o. MRN#: [id number] Attending Physician: Tresa Moore, MD Primary Care Physician: [facility] Admit Date: 07/18/2023  Reason for Consultation/Follow-up: Establishing goals of care  Subjective: Notes and labs reviewed.  In to see patient.  He is currently sitting in bed at this time.  His son Barbara Cower who lives in [place] is at bedside.  Barbara Cower discusses that his brother Link Snuffer lives with his father, and has never lived alone.  He states he is able to work part-time.  He discusses fears of what will happen with Link Snuffer after his father dies.  He states the decision has been made to shift to full comfort care, with no further life-prolonging care including antibiotics.  He discusses that patient began to have obvious rectal bleeding prior to reinitiation of heparin infusion.  Yesterday, we discussed hospice care which both brothers were amenable to.  I completed a MOST form today with son Barbara Cower and the signed original was placed in the chart. Each section of options on the form were reviewed in full detail and any questions were answered as needed. The form was scanned and sent to medical records for it to be uploaded under ACP tab in Epic. A photocopy was also placed in the chart to be scanned into EMR. The patient outlined their wishes for the following treatment decisions:  Cardiopulmonary Resuscitation: Do Not Attempt Resuscitation (DNR/No CPR)  Medical Interventions: Comfort Measures: Keep clean, warm, and dry. Use medication by any route, positioning, wound care, and other measures to relieve pain and suffering. Use oxygen, suction and manual treatment of airway obstruction as  needed for comfort. Do not transfer to the hospital unless comfort needs cannot be met in current location.  Antibiotics: No antibiotics (use other measures to relieve symptoms)  IV Fluids: No IV fluids (provide other measures to ensure comfort)  Feeding Tube: No feeding tube     Length of Stay: 5  Current Medications: Scheduled Meds:   allopurinol  200 mg Oral Daily   aspirin EC  81 mg Oral Daily   atorvastatin  40 mg Oral QHS   feeding supplement  237 mL Oral BID BM   Gerhardt's butt cream   Topical Daily  latanoprost  1 drop Both Eyes QHS    Continuous Infusions:   PRN Meds: acetaminophen, albuterol, dextromethorphan-guaiFENesin, diphenhydrAMINE, metoprolol tartrate, oxyCODONE  Physical Exam Pulmonary:     Effort: Pulmonary effort is normal.  Neurological:     Mental Status: He is alert.             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   Multifocal pneumonia 07/18/2023   Acute metabolic encephalopathy 07/18/2023   Sepsis due to pneumonia (HCC) 07/18/2023   Renal mass 07/18/2023   Thyroid nodule 07/18/2023   Obesity (BMI 30-39.9) 07/18/2023   Chronic diastolic CHF (congestive heart failure) (HCC) 07/18/2023   Portal vein thrombosis 07/18/2023   AKI (acute kidney injury) (HCC) 07/18/2023   SVT (supraventricular tachycardia) (HCC) 07/18/2023   UTI (urinary tract  infection) 06/23/2023   Altered mental status 06/22/2023   Benign prostatic hyperplasia 06/22/2023   Primary open-angle glaucoma 06/22/2023   Retention of urine 06/22/2023   Acute cystitis 06/22/2023   Cardiomyopathy (HCC) 06/22/2023   C5 cervical fracture (HCC) 08/29/2021   Vitamin B12 deficiency 04/14/2021   NSTEMI (non-ST elevated myocardial infarction) (HCC) 04/12/2021   CAD (coronary artery disease) 04/12/2021   Rhabdomyolysis 04/12/2021   Essential hypertension 04/12/2021   AMS (altered mental status) 04/12/2021   Frequent falls 04/12/2021   H/O supraventricular tachycardia 10/04/2006   Sleep apnea 09/29/2006    Palliative Care Assessment & Plan     Recommendations/Plan: I would recommend hospice facility placement. Patient with frank rectal bleeding.  He has nonocclusive mesenteric and portal vein thrombosis, but is unable to tolerate anticoagulation.  He has bilateral PNA and UTI. He has dementia. Poor p.o. intake.   Code Status:    Code Status Orders  (From admission, onward)           Start     Ordered   07/19/23 1430  Do not attempt resuscitation (DNR)- Limited -Do Not Intubate (DNI)  (Code Status)  Continuous       Question Answer Comment  If pulseless and not breathing No CPR or chest compressions.   In Pre-Arrest Conditions (Patient Is Breathing and Has A Pulse) Do not intubate. Provide all appropriate non-invasive medical interventions. Avoid ICU transfer unless indicated or required.   Consent: Discussion documented in EHR or advanced directives reviewed      07/19/23 1429           Code Status History     Date Active Date Inactive Code Status Order ID Comments User Context   07/18/2023 2110 07/19/2023 1429 Full Code [id number]  Lorretta Harp, MD ED   06/22/2023 1310 06/24/2023 1645 Full Code [id number]  Verdene Lennert, MD ED   04/12/2021 1149 04/15/2021 2215 Full Code [id number]  Lucile Shutters, MD  ED       Prognosis: Poor   Care plan was  discussed with team via epic chat  Thank you for allowing the Palliative Medicine Team to assist in the care of this patient.   Morton Stall, NP  Please contact Palliative Medicine Team phone at [phone] for questions and concerns.

## 2023-07-23 NOTE — Plan of Care (Signed)
  Problem: Education: Goal: Knowledge of General Education information will improve Description: Including pain rating scale, medication(s)/side effects and non-pharmacologic comfort measures Outcome: Progressing   Problem: Clinical Measurements: Goal: Will remain free from infection Outcome: Progressing   Problem: Nutrition: Goal: Adequate nutrition will be maintained Outcome: Progressing   Problem: Elimination: Goal: Will not experience complications related to bowel motility Outcome: Progressing Goal: Will not experience complications related to urinary retention Outcome: Progressing   Problem: Pain Managment: Goal: General experience of comfort will improve and/or be controlled Outcome: Progressing   Problem: Safety: Goal: Ability to remain free from injury will improve Outcome: Progressing   Problem: Skin Integrity: Goal: Risk for impaired skin integrity will decrease Outcome: Progressing

## 2023-07-23 NOTE — Progress Notes (Signed)
 PROGRESS NOTE    Steven Jacobson  ZOX:[id number] DOB: 01/09/1934 DOA: 07/18/2023 PCP: [facility]    Brief Narrative:  [phone] with h/o HTN, HLD, CAD, chronic diastolic CHF, gout, and class 1 obesity who presented on 2/28 with n/v and cough and was found to have sepsis due to multifocal PNA as well as an extensive nonocclusive thrombus of the portal and superior mesenteric veins and a L renal mass. He was started on Ceftriaxone and doxycycline with improvement in sepsis physiology. He was also started on heparin infusion with vascular surgery consultation (no other recommendations).   Patient not a candidate for any sort of intervention.  I attempted to restart heparin on 3/4.  Prior to initiation of heparin gtt. patient had frankly melenic stools and heparin was not initiated.  Discussion between patient, patient's son at bedside, palliative care decision made to proceed with full comfort measures and hospice referral.  Assessment & Plan:   Principal Problem:   Multifocal pneumonia Active Problems:   Sepsis due to pneumonia (HCC)   Superior mesenteric vein thrombosis (HCC)   Portal vein thrombosis   Acute metabolic encephalopathy   CAD (coronary artery disease)   Essential hypertension   Chronic diastolic CHF (congestive heart failure) (HCC)   AKI (acute kidney injury) (HCC)   Renal mass   Thyroid nodule   SVT (supraventricular tachycardia) (HCC)   Obesity (BMI 30-39.9)   Medical orders for scope of treatment form in chart  Patient has been transition to full comfort measures after discussion with palliative care.  All medications not focused on patient comfort have been discontinued including antibiotics.  Hospice referral in progress.  Medical opinion patient is appropriate for inpatient hospice facility.  Remains a DNR.   DVT prophylaxis: None/comfort Code Status: DNR/DNI Family Communication:Son Jason at bedside 3/5 Disposition Plan: Status is: Inpatient Remains  inpatient appropriate because: Full comfort measures.  Hospice referral in progress.  Discharge pending hospice acceptance.   Level of care: Telemetry Medical  Consultants:  Palliative care  Procedures:  None  Antimicrobials: None   Subjective: Seen and examined.  Sitting up in bed.  No visible distress.  History limited by underlying dementia.  Son at bedside.  Objective: Vitals:   07/22/23 1955 07/23/23 0030 07/23/23 0341 07/23/23 0731  BP: 128/77 (!) 117/59 (!) 147/84 126/68  Pulse: 91 85 (!) 102 92  Resp:   18 15  Temp: 98.5 F (36.9 C) 98.2 F (36.8 C) 98.3 F (36.8 C) 98.7 F (37.1 C)  TempSrc:   Oral   SpO2: 99% 96% 100% 96%  Weight:      Height:        Intake/Output Summary (Last 24 hours) at 07/23/2023 1329 Last data filed at 07/23/2023 1015 Gross per 24 hour  Intake 480 ml  Output --  Net 480 ml   Filed Weights   07/18/23 1954 07/20/23 0516  Weight: 107.8 kg 102.7 kg    Examination:  General exam: Appears calm and comfortable  Respiratory system: Clear to auscultation. Respiratory effort normal. Cardiovascular system: S1-S2, RRR Gastrointestinal system: Soft, NT/ND, normal bowel sounds Central nervous system: Alert.  Oriented to person.  No focal deficits Extremities: Symmetric 5 x 5 power. Skin: No rashes, lesions or ulcers Psychiatry: Judgement and insight appear impaired. Mood & affect confused.     Data Reviewed: I have personally reviewed following labs and imaging studies  CBC: Recent Labs  Lab 07/21/23 0524 07/21/23 1817 07/22/23 0455 07/22/23 1751 07/23/23 0501  WBC 23.2* 23.6* 20.7* 24.7* 20.0*  NEUTROABS 18.1* 17.7* 15.9* 18.7* 14.8*  HGB 8.9* 8.8* 8.4* 8.8* 8.7*  HCT 26.8* 26.8* 25.3* 27.6* 26.0*  MCV 86.7 89.9 88.2 91.4 90.0  PLT 189 199 191 208 194   Basic Metabolic Panel: Recent Labs  Lab 07/18/23 1719 07/19/23 0308 07/20/23 1014 07/21/23 0524 07/22/23 0455 07/23/23 0501  NA 133* 134* 135 134* 132* 134*  K 4.5  4.1 3.8 4.2 3.9 4.2  CL 101 106 108 112* 106 109  CO2 20* 17* 20* 18* 18* 20*  GLUCOSE 122* 120* 109* 99 99 87  BUN 54* 44* 45* 57* 46* 31*  CREATININE 1.67* 1.26* 1.18 1.07 1.00 0.92  CALCIUM 8.1* 7.3* 7.5* 7.5* 7.4* 7.5*  MG 2.4  --   --   --   --   --    GFR: Estimated Creatinine Clearance: 72.3 mL/min (by C-G formula based on SCr of 0.92 mg/dL). Liver Function Tests: Recent Labs  Lab 07/18/23 1719 07/19/23 0308  AST 44* 42*  ALT 33 26  ALKPHOS 117 108  BILITOT 1.9* 1.4*  PROT 6.8 5.7*  ALBUMIN 2.1* 1.8*   Recent Labs  Lab 07/18/23 1719  LIPASE 52*   Recent Labs  Lab 07/19/23 0308  AMMONIA 34   Coagulation Profile: Recent Labs  Lab 07/18/23 2037  INR 1.4*   Cardiac Enzymes: Recent Labs  Lab 07/18/23 1719  CKTOTAL 49   BNP (last 3 results) No results for input(s): "PROBNP" in the last 8760 hours. HbA1C: No results for input(s): "HGBA1C" in the last 72 hours. CBG: Recent Labs  Lab 07/20/23 0659  GLUCAP 110*   Lipid Profile: No results for input(s): "CHOL", "HDL", "LDLCALC", "TRIG", "CHOLHDL", "LDLDIRECT" in the last 72 hours. Thyroid Function Tests: No results for input(s): "TSH", "T4TOTAL", "FREET4", "T3FREE", "THYROIDAB" in the last 72 hours. Anemia Panel: No results for input(s): "VITAMINB12", "FOLATE", "FERRITIN", "TIBC", "IRON", "RETICCTPCT" in the last 72 hours. Sepsis Labs: Recent Labs  Lab 07/18/23 1719 07/18/23 2037  LATICACIDVEN 2.3* 2.0*    Recent Results (from the past 240 hours)  Blood Culture (routine x 2)     Status: None   Collection Time: 07/18/23  5:05 PM   Specimen: BLOOD LEFT ARM  Result Value Ref Range Status   Specimen Description BLOOD LEFT ARM  Final   Special Requests   Final    BOTTLES DRAWN AEROBIC AND ANAEROBIC Blood Culture results may not be optimal due to an inadequate volume of blood received in culture bottles   Culture   Final    NO GROWTH 5 DAYS Performed at [facility], [address]  [place], [place] [address]    Report Status 07/23/2023 FINAL  Final  Blood Culture (routine x 2)     Status: None   Collection Time: 07/18/23  5:05 PM   Specimen: BLOOD RIGHT ARM  Result Value Ref Range Status   Specimen Description BLOOD RIGHT ARM  Final   Special Requests   Final    BOTTLES DRAWN AEROBIC AND ANAEROBIC Blood Culture results may not be optimal due to an inadequate volume of blood received in culture bottles   Culture   Final    NO GROWTH 5 DAYS Performed at [facility], [address] [place], [place] [address]    Report Status 07/23/2023 FINAL  Final  Resp panel by RT-PCR (RSV, Flu A&B, Covid) Urine, Catheterized     Status: None   Collection Time: 07/18/23  5:19  PM   Specimen: Urine, Catheterized; Nasal Swab  Result Value Ref Range Status   SARS Coronavirus 2 by RT PCR NEGATIVE NEGATIVE Final    Comment: (NOTE) SARS-CoV-2 target nucleic acids are NOT DETECTED.  The SARS-CoV-2 RNA is generally detectable in upper respiratory specimens during the acute phase of infection. The lowest concentration of SARS-CoV-2 viral copies this assay can detect is 138 copies/mL. A negative result does not preclude SARS-Cov-2 infection and should not be used as the sole basis for treatment or other patient management decisions. A negative result may occur with  improper specimen collection/handling, submission of specimen other than nasopharyngeal swab, presence of viral mutation(s) within the areas targeted by this assay, and inadequate number of viral copies(<138 copies/mL). A negative result must be combined with clinical observations, patient history, and epidemiological information. The expected result is Negative.  Fact Sheet for Patients:  BloggerCourse.com  Fact Sheet for Healthcare Providers:  SeriousBroker.it  This test is no t yet approved or cleared by the [place] FDA and  has been authorized for  detection and/or diagnosis of SARS-CoV-2 by FDA under an Emergency Use Authorization (EUA). This EUA will remain  in effect (meaning this test can be used) for the duration of the COVID-19 declaration under Section 564(b)(1) of the Act, 21 U.S.C.section 360bbb-3(b)(1), unless the authorization is terminated  or revoked sooner.       Influenza A by PCR NEGATIVE NEGATIVE Final   Influenza B by PCR NEGATIVE NEGATIVE Final    Comment: (NOTE) The Xpert Xpress SARS-CoV-2/FLU/RSV plus assay is intended as an aid in the diagnosis of influenza from Nasopharyngeal swab specimens and should not be used as a sole basis for treatment. Nasal washings and aspirates are unacceptable for Xpert Xpress SARS-CoV-2/FLU/RSV testing.  Fact Sheet for Patients: BloggerCourse.com  Fact Sheet for Healthcare Providers: SeriousBroker.it  This test is not yet approved or cleared by the [place] FDA and has been authorized for detection and/or diagnosis of SARS-CoV-2 by FDA under an Emergency Use Authorization (EUA). This EUA will remain in effect (meaning this test can be used) for the duration of the COVID-19 declaration under Section 564(b)(1) of the Act, 21 U.S.C. section 360bbb-3(b)(1), unless the authorization is terminated or revoked.     Resp Syncytial Virus by PCR NEGATIVE NEGATIVE Final    Comment: (NOTE) Fact Sheet for Patients: BloggerCourse.com  Fact Sheet for Healthcare Providers: SeriousBroker.it  This test is not yet approved or cleared by the [place] FDA and has been authorized for detection and/or diagnosis of SARS-CoV-2 by FDA under an Emergency Use Authorization (EUA). This EUA will remain in effect (meaning this test can be used) for the duration of the COVID-19 declaration under Section 564(b)(1) of the Act, 21 U.S.C. section 360bbb-3(b)(1), unless the authorization is  terminated or revoked.  Performed at [facility], [address] [place], [place] [address]   Expectorated Sputum Assessment w Gram Stain, Rflx to Resp Cult     Status: None   Collection Time: 07/19/23 10:21 PM   Specimen: Expectorated Sputum  Result Value Ref Range Status   Specimen Description EXPECTORATED SPUTUM  Final   Special Requests NONE  Final   Sputum evaluation   Final    THIS SPECIMEN IS ACCEPTABLE FOR SPUTUM CULTURE Performed at [facility], [address], [place], [place] [address]    Report Status 07/19/2023 FINAL  Final  Culture, Respiratory w Gram Stain     Status: None   Collection Time:  07/19/23 10:21 PM  Result Value Ref Range Status   Specimen Description   Final    EXPECTORATED SPUTUM Performed at [facility], [address] [place], [place] [address]    Special Requests   Final    NONE Reflexed from [id number] Performed at [facility], [address] [place], [place] [address]    Gram Stain   Final    MODERATE WBC PRESENT,BOTH PMN AND MONONUCLEAR FEW GRAM POSITIVE COCCI IN CHAINS FEW YEAST WITH PSEUDOHYPHAE Performed at [facility], [address], [place], [place] [address]    Culture MODERATE CANDIDA ALBICANS  Final   Report Status 07/22/2023 FINAL  Final  C Difficile Quick Screen w PCR reflex     Status: None   Collection Time: 07/20/23 10:33 AM   Specimen: STOOL  Result Value Ref Range Status   C Diff antigen NEGATIVE NEGATIVE Final   C Diff toxin NEGATIVE NEGATIVE Final   C Diff interpretation No C. difficile detected.  Final    Comment: Performed at [facility], [address] [place], [place] [address]  Gastrointestinal Panel by PCR , Stool     Status: None   Collection Time: 07/20/23 10:33 AM   Specimen: STOOL  Result Value Ref Range Status   Campylobacter species NOT DETECTED NOT DETECTED Final   Plesimonas shigelloides NOT DETECTED NOT DETECTED Final   Salmonella species  NOT DETECTED NOT DETECTED Final   Yersinia enterocolitica NOT DETECTED NOT DETECTED Final   Vibrio species NOT DETECTED NOT DETECTED Final   Vibrio cholerae NOT DETECTED NOT DETECTED Final   Enteroaggregative E coli (EAEC) NOT DETECTED NOT DETECTED Final   Enteropathogenic E coli (EPEC) NOT DETECTED NOT DETECTED Final   Enterotoxigenic E coli (ETEC) NOT DETECTED NOT DETECTED Final   Shiga like toxin producing E coli (STEC) NOT DETECTED NOT DETECTED Final   Shigella/Enteroinvasive E coli (EIEC) NOT DETECTED NOT DETECTED Final   Cryptosporidium NOT DETECTED NOT DETECTED Final   Cyclospora cayetanensis NOT DETECTED NOT DETECTED Final   Entamoeba histolytica NOT DETECTED NOT DETECTED Final   Giardia lamblia NOT DETECTED NOT DETECTED Final   Adenovirus F40/41 NOT DETECTED NOT DETECTED Final   Astrovirus NOT DETECTED NOT DETECTED Final   Norovirus GI/GII NOT DETECTED NOT DETECTED Final   Rotavirus A NOT DETECTED NOT DETECTED Final   Sapovirus (I, II, IV, and V) NOT DETECTED NOT DETECTED Final    Comment: Performed at [facility], [address] [place], [place] [address]         Radiology Studies: No results found.      Scheduled Meds:  allopurinol  200 mg Oral Daily   aspirin EC  81 mg Oral Daily   feeding supplement  237 mL Oral BID BM   Gerhardt's butt cream   Topical Daily   latanoprost  1 drop Both Eyes QHS   Continuous Infusions:   LOS: 5 days     Tresa Moore, MD [facility]   If 7PM-7AM, please contact night-coverage  07/23/2023, 1:29 PM

## 2023-07-23 NOTE — Progress Notes (Signed)
 [facility] Liaison Note  Received request from Transitions of Care Manger for family interest in [facility].  Met with patient and son, Barbara Cower,  to confirm interest and explain services. Patient not appropriate for the [facility] at this time. We are happy to re-evaluate if there are changes in patient's condition.  Son, [facility] and hospital team aware.  Hospice can follow patient at a LTC facility.   Please call with any Hospice related questions or concerns.  Thank you for the opportunity to participate in this patient's care.    Redge Gainer,  [facility] Liaison [phone]

## 2023-07-24 DIAGNOSIS — Z7189 Other specified counseling: Secondary | ICD-10-CM | POA: Diagnosis not present

## 2023-07-24 DIAGNOSIS — J189 Pneumonia, unspecified organism: Secondary | ICD-10-CM | POA: Diagnosis not present

## 2023-07-24 MED ORDER — TRAZODONE HCL 50 MG PO TABS
50.0000 mg | ORAL_TABLET | Freq: Every day | ORAL | Status: DC
  Administered 2023-07-24: 50 mg via ORAL
  Filled 2023-07-24: qty 1

## 2023-07-24 MED ORDER — LOPERAMIDE HCL 2 MG PO CAPS
2.0000 mg | ORAL_CAPSULE | ORAL | Status: DC | PRN
  Administered 2023-07-24: 2 mg via ORAL
  Filled 2023-07-24: qty 1

## 2023-07-24 NOTE — Progress Notes (Addendum)
 Daily Progress Note   Patient Name: Steven Jacobson       Date: 07/24/2023 DOB: [birth year]  Age: 89 y.o. MRN#: [id number] Attending Physician: Tresa Moore, MD Primary Care Physician: [facility] Admit Date: 07/18/2023  Reason for Consultation/Follow-up: Establishing goals of care  Subjective: Notes and labs reviewed.  In to see patient.  He is currently sitting in bed with son at bedside.  Son stepped out to speak with me.  He discusses concerns for where his father will go from here.  He discusses that his brother has lived with patient as he is not able to live independently.  He is clear that his brother is not able to care for their father at home.   He states he would like to expand the search for hospice facility placement.  He also states his father was in the [employer] and should have VA benefits.  TOC joined conversation.  TOC to continue working on dispo.   Length of Stay: 6  Current Medications: Scheduled Meds:   allopurinol  200 mg Oral Daily   aspirin EC  81 mg Oral Daily   feeding supplement  237 mL Oral BID BM   Gerhardt's butt cream   Topical Daily   latanoprost  1 drop Both Eyes QHS   traZODone  50 mg Oral QHS    Continuous Infusions:   PRN Meds: acetaminophen, albuterol, dextromethorphan-guaiFENesin, diphenhydrAMINE, haloperidol, haloperidol, metoprolol tartrate, oxyCODONE  Physical Exam Pulmonary:     Effort: Pulmonary effort is normal.  Neurological:     Mental Status: He is alert.             Vital Signs: BP (!) 145/84 (BP Location: Left Arm)   Pulse (!) 58   Temp (!) 97.5 F (36.4 C)   Resp 18   Ht 6\' 2"  (1.88 m)   Wt 102.7 kg   SpO2 94%   BMI 29.07 kg/m  SpO2: SpO2: 94 % O2 Device: O2 Device: Room Air O2 Flow Rate: O2 Flow Rate  (L/min): 2 L/min  Intake/output summary: No intake or output data in the 24 hours ending 07/24/23 1223 LBM: Last BM Date : 07/24/23 Baseline Weight: Weight: 107.8 kg Most recent weight: Weight: 102.7 kg   Patient Active Problem List   Diagnosis Date Noted   Medical  orders for scope of treatment form in chart 07/23/2023   Superior mesenteric vein thrombosis (HCC) 07/18/2023   Multifocal pneumonia 07/18/2023   Acute metabolic encephalopathy 07/18/2023   Sepsis due to pneumonia (HCC) 07/18/2023   Renal mass 07/18/2023   Thyroid nodule 07/18/2023   Obesity (BMI 30-39.9) 07/18/2023   Chronic diastolic CHF (congestive heart failure) (HCC) 07/18/2023   Portal vein thrombosis 07/18/2023   AKI (acute kidney injury) (HCC) 07/18/2023   SVT (supraventricular tachycardia) (HCC) 07/18/2023   UTI (urinary tract infection) 06/23/2023   Altered mental status 06/22/2023   Benign prostatic hyperplasia 06/22/2023   Primary open-angle glaucoma 06/22/2023   Retention of urine 06/22/2023   Acute cystitis 06/22/2023   Cardiomyopathy (HCC) 06/22/2023   C5 cervical fracture (HCC) 08/29/2021   Vitamin B12 deficiency 04/14/2021   NSTEMI (non-ST elevated myocardial infarction) (HCC) 04/12/2021   CAD (coronary artery disease) 04/12/2021   Rhabdomyolysis 04/12/2021   Essential hypertension 04/12/2021   AMS (altered mental status) 04/12/2021   Frequent falls 04/12/2021   H/O supraventricular tachycardia 10/04/2006   Sleep apnea 09/29/2006    Palliative Care Assessment & Plan     Recommendations/Plan: Family would like hospice facility placement and would like to expand facility search as distance is not an issue.   Code Status:    Code Status Orders  (From admission, onward)           Start     Ordered   07/19/23 1430  Do not attempt resuscitation (DNR)- Limited -Do Not Intubate (DNI)  (Code Status)  Continuous       Question Answer Comment  If pulseless and not breathing No CPR or  chest compressions.   In Pre-Arrest Conditions (Patient Is Breathing and Has A Pulse) Do not intubate. Provide all appropriate non-invasive medical interventions. Avoid ICU transfer unless indicated or required.   Consent: Discussion documented in EHR or advanced directives reviewed      07/19/23 1429           Code Status History     Date Active Date Inactive Code Status Order ID Comments User Context   07/18/2023 2110 07/19/2023 1429 Full Code [id number]  Lorretta Harp, MD ED   06/22/2023 1310 06/24/2023 1645 Full Code [id number]  Verdene Lennert, MD ED   04/12/2021 1149 04/15/2021 2215 Full Code [id number]  Lucile Shutters, MD ED       Prognosis:  poor    Care plan was discussed with [facility], RN, attending  Thank you for allowing the Palliative Medicine Team to assist in the care of this patient.   Morton Stall, NP  Please contact Palliative Medicine Team phone at [phone] for questions and concerns.

## 2023-07-24 NOTE — Progress Notes (Signed)
  [facility] Liaison Note     Hospital Liaison team will follow peripherally while waiting for a discharge disposition.  Hospice can follow at a LTC facility.    Please call with any Hospice related questions or concerns.    Thank you for the opportunity to participate in this patient's care  [facility] Liaison [phone]

## 2023-07-24 NOTE — Progress Notes (Signed)
 Nutrition Brief Note  Chart reviewed. Pt now transitioning to comfort care.  No further nutrition interventions planned at this time.  Please re-consult as needed.   Levada Schilling, RD, LDN, CDCES Registered Dietitian III Certified Diabetes Care and Education Specialist If unable to reach this RD, please use "RD Inpatient" group chat on secure chat between hours of 8am-4 pm daily

## 2023-07-24 NOTE — [place] FL2 (Signed)
 [place] MEDICAID FL2 LEVEL OF CARE FORM     IDENTIFICATION  Patient Name: Steven Jacobson Birthdate: 06/28/1933 Sex: male Admission Date (Current Location): 07/18/2023  [facility] and [place] Number:  Chiropodist and Address:  [facility], 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   Multifocal pneumonia 07/18/2023   Acute metabolic encephalopathy 07/18/2023   Sepsis due to pneumonia (HCC) 07/18/2023   Renal mass 07/18/2023   Thyroid nodule 07/18/2023   Obesity (BMI 30-39.9) 07/18/2023   Chronic diastolic CHF (congestive heart failure) (HCC) 07/18/2023   Portal vein thrombosis 07/18/2023   AKI (acute kidney injury) (HCC) 07/18/2023   SVT (supraventricular tachycardia) (HCC) 07/18/2023   UTI (urinary tract infection) 06/23/2023   Altered mental status 06/22/2023   Benign prostatic hyperplasia 06/22/2023   Primary open-angle glaucoma 06/22/2023   Retention of urine 06/22/2023   Acute cystitis 06/22/2023   Cardiomyopathy (HCC) 06/22/2023   C5 cervical fracture (HCC) 08/29/2021   Vitamin B12 deficiency 04/14/2021   NSTEMI (non-ST elevated myocardial infarction) (HCC) 04/12/2021   CAD (coronary artery disease) 04/12/2021   Rhabdomyolysis 04/12/2021   Essential hypertension 04/12/2021   AMS (altered mental status) 04/12/2021   Frequent falls 04/12/2021   H/O supraventricular tachycardia 10/04/2006   Sleep apnea 09/29/2006     Orientation RESPIRATION BLADDER Height & Weight     Self, Situation, Place  Normal Incontinent Weight: 226 lb 6.6 oz (102.7 kg) Height:  6\' 2"  (188 cm)  BEHAVIORAL SYMPTOMS/MOOD NEUROLOGICAL BOWEL NUTRITION STATUS      Incontinent Diet (see d/c summary)  AMBULATORY STATUS COMMUNICATION OF NEEDS Skin   Extensive Assist Verbally PU Stage and Appropriate Care (Buttocks right stage 2)                       Personal Care Assistance Level of Assistance  Bathing, Feeding, Dressing Bathing Assistance: Maximum assistance Feeding assistance: Independent Dressing Assistance: Limited assistance     Functional Limitations Info  Sight, Hearing, Speech Sight Info: Adequate Hearing Info: Adequate      SPECIAL CARE FACTORS FREQUENCY  OT (By licensed OT), PT (By licensed PT)                    Contractures Contractures Info: Not present    Additional Factors Info  Code Status, Allergies Code Status Info: DNR Allergies Info: Simvastatin, niacin           Current Medications (07/24/2023):  This is the current hospital active medication list Current Facility-Administered Medications  Medication Dose Route Frequency Provider Last Rate Last Admin   acetaminophen (TYLENOL) tablet 650 mg  650 mg Oral Q6H PRN Lorretta Harp, MD       albuterol (PROVENTIL) (2.5 MG/3ML) 0.083% nebulizer solution 2.5 mg  2.5 mg Nebulization Q4H PRN Lorretta Harp, MD       allopurinol (ZYLOPRIM) tablet 200 mg  200 mg Oral Daily Lorretta Harp, MD   200  mg at 07/24/23 0931   aspirin EC tablet 81 mg  81 mg Oral Daily Jonah Blue, MD   81 mg at 07/24/23 0931   dextromethorphan-guaiFENesin (MUCINEX DM) 30-600 MG per 12 hr tablet 1 tablet  1 tablet Oral BID PRN Lorretta Harp, MD   1 tablet at 07/23/23 1610   diphenhydrAMINE (BENADRYL) injection 12.5 mg  12.5 mg Intravenous Q8H PRN Lorretta Harp, MD   12.5 mg at 07/24/23 0421   feeding supplement (ENSURE ENLIVE / ENSURE PLUS) liquid 237 mL  237 mL Oral BID BM Jonah Blue, MD   237 mL at 07/24/23 9604   Gerhardt's butt cream   Topical Daily Lolita Patella B, MD   Given at 07/24/23 0932   haloperidol (HALDOL) 2 MG/ML solution 5 mg  5 mg Oral Q6H PRN Morton Stall, NP       haloperidol (HALDOL) tablet 5 mg  5 mg Oral Q6H PRN Morton Stall, NP       latanoprost (XALATAN) 0.005 % ophthalmic solution 1 drop  1 drop Both Eyes QHS Lorretta Harp, MD   1 drop at 07/22/23 2101   metoprolol tartrate (LOPRESSOR) injection 2.5 mg  2.5 mg Intravenous Q2H PRN Lorretta Harp, MD   2.5 mg at 07/19/23 5409   oxyCODONE (Oxy IR/ROXICODONE) immediate release tablet 5 mg  5 mg Oral Q4H PRN Morton Stall, NP         Discharge Medications: Please see discharge summary for a list of discharge medications.  Relevant Imaging Results:  Relevant Lab Results:   Additional Information SSN [id number] [place]

## 2023-07-24 NOTE — Progress Notes (Signed)
 PROGRESS NOTE    Steven Jacobson  XBJ:[id number] DOB: 1933/09/28 DOA: 07/18/2023 [facility]: [facility]    Brief Narrative:  [phone] with h/o HTN, HLD, CAD, chronic diastolic CHF, gout, and class 1 obesity who presented on 2/28 with n/v and cough and was found to have sepsis due to multifocal PNA as well as an extensive nonocclusive thrombus of the portal and superior mesenteric veins and a L renal mass. He was started on Ceftriaxone and doxycycline with improvement in sepsis physiology. He was also started on heparin infusion with vascular surgery consultation (no other recommendations).   Patient not a candidate for any sort of intervention.  I attempted to restart heparin on 3/4.  Prior to initiation of heparin gtt. patient had frankly melenic stools and heparin was not initiated.  Discussion between patient, patient's son at bedside, palliative care decision made to proceed with full comfort measures and hospice referral.  3/6: Hospice referral requested.  Unfortunately patient was not approved for admission into hospice month.  TOC and palliative care working on alternative disposition plan  Assessment & Plan:   Principal Problem:   Multifocal pneumonia Active Problems:   Sepsis due to pneumonia (HCC)   Superior mesenteric vein thrombosis (HCC)   Portal vein thrombosis   Acute metabolic encephalopathy   CAD (coronary artery disease)   Essential hypertension   Chronic diastolic CHF (congestive heart failure) (HCC)   AKI (acute kidney injury) (HCC)   Renal mass   Thyroid nodule   SVT (supraventricular tachycardia) (HCC)   Obesity (BMI 30-39.9)   Medical orders for scope of treatment form in chart  Patient has been transition to full comfort measures after discussion with palliative care.  All medications not focused on patient comfort have been discontinued including antibiotics.    Disposition plan in question.  TOC working with patient's son to determine appropriate post  discharge plan.  Patient remains DNR.   DVT prophylaxis: None/comfort Code Status: DNR/DNI Family Communication:Son Jason at bedside 3/5, 3/6 Disposition Plan: Status is: Inpatient Remains inpatient appropriate because: Full comfort measures.  Disposition plan continuing   Level of care: Telemetry Medical  Consultants:  Palliative care  Procedures:  None  Antimicrobials: None   Subjective: Seen and examined.  Sitting up in bed.  No distress.  History limited by dementia.  Answers questions appropriately.  Son at bedside.  Objective: Vitals:   07/23/23 0731 07/23/23 1540 07/24/23 0500 07/24/23 0732  BP: 126/68 105/60 (!) 102/58 (!) 145/84  Pulse: 92 88 68 (!) 58  Resp: 15 18 16 18   Temp: 98.7 F (37.1 C) 98.5 F (36.9 C) 98 F (36.7 C) (!) 97.5 F (36.4 C)  TempSrc:  Oral Oral   SpO2: 96% 98% 98% 94%  Weight:      Height:       No intake or output data in the 24 hours ending 07/24/23 1324  Filed Weights   07/18/23 1954 07/20/23 0516  Weight: 107.8 kg 102.7 kg    Examination:  General exam: NAD.  Appears frail Respiratory system: Lungs clear.  Normal work of breathing.  Room air Cardiovascular system: S1-S2, RRR Gastrointestinal system: Soft, NT/ND, normal bowel sounds Central nervous system: Alert.  Oriented to person.  No focal deficits Extremities: Symmetric 5 x 5 power. Skin: No rashes, lesions or ulcers Psychiatry: Judgement and insight appear impaired. Mood & affect confused.     Data Reviewed: I have personally reviewed following labs and imaging studies  CBC: Recent Labs  Lab 07/21/23 0524 07/21/23 1817 07/22/23 0455 07/22/23 1751 07/23/23 0501  WBC 23.2* 23.6* 20.7* 24.7* 20.0*  NEUTROABS 18.1* 17.7* 15.9* 18.7* 14.8*  HGB 8.9* 8.8* 8.4* 8.8* 8.7*  HCT 26.8* 26.8* 25.3* 27.6* 26.0*  MCV 86.7 89.9 88.2 91.4 90.0  PLT 189 199 191 208 194   Basic Metabolic Panel: Recent Labs  Lab 07/18/23 1719 07/19/23 0308 07/20/23 1014  07/21/23 0524 07/22/23 0455 07/23/23 0501  NA 133* 134* 135 134* 132* 134*  K 4.5 4.1 3.8 4.2 3.9 4.2  CL 101 106 108 112* 106 109  CO2 20* 17* 20* 18* 18* 20*  GLUCOSE 122* 120* 109* 99 99 87  BUN 54* 44* 45* 57* 46* 31*  CREATININE 1.67* 1.26* 1.18 1.07 1.00 0.92  CALCIUM 8.1* 7.3* 7.5* 7.5* 7.4* 7.5*  MG 2.4  --   --   --   --   --    GFR: Estimated Creatinine Clearance: 72.3 mL/min (by C-G formula based on SCr of 0.92 mg/dL). Liver Function Tests: Recent Labs  Lab 07/18/23 1719 07/19/23 0308  AST 44* 42*  ALT 33 26  ALKPHOS 117 108  BILITOT 1.9* 1.4*  PROT 6.8 5.7*  ALBUMIN 2.1* 1.8*   Recent Labs  Lab 07/18/23 1719  LIPASE 52*   Recent Labs  Lab 07/19/23 0308  AMMONIA 34   Coagulation Profile: Recent Labs  Lab 07/18/23 2037  INR 1.4*   Cardiac Enzymes: Recent Labs  Lab 07/18/23 1719  CKTOTAL 49   BNP (last 3 results) No results for input(s): "PROBNP" in the last 8760 hours. HbA1C: No results for input(s): "HGBA1C" in the last 72 hours. CBG: Recent Labs  Lab 07/20/23 0659  GLUCAP 110*   Lipid Profile: No results for input(s): "CHOL", "HDL", "LDLCALC", "TRIG", "CHOLHDL", "LDLDIRECT" in the last 72 hours. Thyroid Function Tests: No results for input(s): "TSH", "T4TOTAL", "FREET4", "T3FREE", "THYROIDAB" in the last 72 hours. Anemia Panel: No results for input(s): "VITAMINB12", "FOLATE", "FERRITIN", "TIBC", "IRON", "RETICCTPCT" in the last 72 hours. Sepsis Labs: Recent Labs  Lab 07/18/23 1719 07/18/23 2037  LATICACIDVEN 2.3* 2.0*    Recent Results (from the past 240 hours)  Blood Culture (routine x 2)     Status: None   Collection Time: 07/18/23  5:05 PM   Specimen: BLOOD LEFT ARM  Result Value Ref Range Status   Specimen Description BLOOD LEFT ARM  Final   Special Requests   Final    BOTTLES DRAWN AEROBIC AND ANAEROBIC Blood Culture results may not be optimal due to an inadequate volume of blood received in culture bottles   Culture    Final    NO GROWTH 5 DAYS Performed at [facility], [address] [place], [place] [address]    Report Status 07/23/2023 FINAL  Final  Blood Culture (routine x 2)     Status: None   Collection Time: 07/18/23  5:05 PM   Specimen: BLOOD RIGHT ARM  Result Value Ref Range Status   Specimen Description BLOOD RIGHT ARM  Final   Special Requests   Final    BOTTLES DRAWN AEROBIC AND ANAEROBIC Blood Culture results may not be optimal due to an inadequate volume of blood received in culture bottles   Culture   Final    NO GROWTH 5 DAYS Performed at [facility], [address] [place], [place] [address]    Report Status 07/23/2023 FINAL  Final  Resp panel by RT-PCR (RSV, Flu A&B, Covid) Urine, Catheterized  Status: None   Collection Time: 07/18/23  5:19 PM   Specimen: Urine, Catheterized; Nasal Swab  Result Value Ref Range Status   SARS Coronavirus 2 by RT PCR NEGATIVE NEGATIVE Final    Comment: (NOTE) SARS-CoV-2 target nucleic acids are NOT DETECTED.  The SARS-CoV-2 RNA is generally detectable in upper respiratory specimens during the acute phase of infection. The lowest concentration of SARS-CoV-2 viral copies this assay can detect is 138 copies/mL. A negative result does not preclude SARS-Cov-2 infection and should not be used as the sole basis for treatment or other patient management decisions. A negative result may occur with  improper specimen collection/handling, submission of specimen other than nasopharyngeal swab, presence of viral mutation(s) within the areas targeted by this assay, and inadequate number of viral copies(<138 copies/mL). A negative result must be combined with clinical observations, patient history, and epidemiological information. The expected result is Negative.  Fact Sheet for Patients:  BloggerCourse.com  Fact Sheet for Healthcare Providers:  SeriousBroker.it  This test is no  t yet approved or cleared by the [place] FDA and  has been authorized for detection and/or diagnosis of SARS-CoV-2 by FDA under an Emergency Use Authorization (EUA). This EUA will remain  in effect (meaning this test can be used) for the duration of the COVID-19 declaration under Section 564(b)(1) of the Act, 21 U.S.C.section 360bbb-3(b)(1), unless the authorization is terminated  or revoked sooner.       Influenza A by PCR NEGATIVE NEGATIVE Final   Influenza B by PCR NEGATIVE NEGATIVE Final    Comment: (NOTE) The Xpert Xpress SARS-CoV-2/FLU/RSV plus assay is intended as an aid in the diagnosis of influenza from Nasopharyngeal swab specimens and should not be used as a sole basis for treatment. Nasal washings and aspirates are unacceptable for Xpert Xpress SARS-CoV-2/FLU/RSV testing.  Fact Sheet for Patients: BloggerCourse.com  Fact Sheet for Healthcare Providers: SeriousBroker.it  This test is not yet approved or cleared by the [place] FDA and has been authorized for detection and/or diagnosis of SARS-CoV-2 by FDA under an Emergency Use Authorization (EUA). This EUA will remain in effect (meaning this test can be used) for the duration of the COVID-19 declaration under Section 564(b)(1) of the Act, 21 U.S.C. section 360bbb-3(b)(1), unless the authorization is terminated or revoked.     Resp Syncytial Virus by PCR NEGATIVE NEGATIVE Final    Comment: (NOTE) Fact Sheet for Patients: BloggerCourse.com  Fact Sheet for Healthcare Providers: SeriousBroker.it  This test is not yet approved or cleared by the [place] FDA and has been authorized for detection and/or diagnosis of SARS-CoV-2 by FDA under an Emergency Use Authorization (EUA). This EUA will remain in effect (meaning this test can be used) for the duration of the COVID-19 declaration under Section  564(b)(1) of the Act, 21 U.S.C. section 360bbb-3(b)(1), unless the authorization is terminated or revoked.  Performed at [facility], [address] [place], [place] [address]   Expectorated Sputum Assessment w Gram Stain, Rflx to Resp Cult     Status: None   Collection Time: 07/19/23 10:21 PM   Specimen: Expectorated Sputum  Result Value Ref Range Status   Specimen Description EXPECTORATED SPUTUM  Final   Special Requests NONE  Final   Sputum evaluation   Final    THIS SPECIMEN IS ACCEPTABLE FOR SPUTUM CULTURE Performed at [facility], [address] [place], [place] [address]    Report Status 07/19/2023 FINAL  Final  Culture, Respiratory w Gram Stain  Status: None   Collection Time: 07/19/23 10:21 PM  Result Value Ref Range Status   Specimen Description   Final    EXPECTORATED SPUTUM Performed at [facility], [address] [place], [place] [address]    Special Requests   Final    NONE Reflexed from [id number] Performed at [facility], [address] [place], [place] [address]    Gram Stain   Final    MODERATE WBC PRESENT,BOTH PMN AND MONONUCLEAR FEW GRAM POSITIVE COCCI IN CHAINS FEW YEAST WITH PSEUDOHYPHAE Performed at [facility], [address], [place], [place] [address]    Culture MODERATE CANDIDA ALBICANS  Final   Report Status 07/22/2023 FINAL  Final  C Difficile Quick Screen w PCR reflex     Status: None   Collection Time: 07/20/23 10:33 AM   Specimen: STOOL  Result Value Ref Range Status   C Diff antigen NEGATIVE NEGATIVE Final   C Diff toxin NEGATIVE NEGATIVE Final   C Diff interpretation No C. difficile detected.  Final    Comment: Performed at [facility], [address] [place], [place] [address]  Gastrointestinal Panel by PCR , Stool     Status: None   Collection Time: 07/20/23 10:33 AM   Specimen: STOOL  Result Value Ref Range Status   Campylobacter species NOT DETECTED NOT DETECTED  Final   Plesimonas shigelloides NOT DETECTED NOT DETECTED Final   Salmonella species NOT DETECTED NOT DETECTED Final   Yersinia enterocolitica NOT DETECTED NOT DETECTED Final   Vibrio species NOT DETECTED NOT DETECTED Final   Vibrio cholerae NOT DETECTED NOT DETECTED Final   Enteroaggregative E coli (EAEC) NOT DETECTED NOT DETECTED Final   Enteropathogenic E coli (EPEC) NOT DETECTED NOT DETECTED Final   Enterotoxigenic E coli (ETEC) NOT DETECTED NOT DETECTED Final   Shiga like toxin producing E coli (STEC) NOT DETECTED NOT DETECTED Final   Shigella/Enteroinvasive E coli (EIEC) NOT DETECTED NOT DETECTED Final   Cryptosporidium NOT DETECTED NOT DETECTED Final   Cyclospora cayetanensis NOT DETECTED NOT DETECTED Final   Entamoeba histolytica NOT DETECTED NOT DETECTED Final   Giardia lamblia NOT DETECTED NOT DETECTED Final   Adenovirus F40/41 NOT DETECTED NOT DETECTED Final   Astrovirus NOT DETECTED NOT DETECTED Final   Norovirus GI/GII NOT DETECTED NOT DETECTED Final   Rotavirus A NOT DETECTED NOT DETECTED Final   Sapovirus (I, II, IV, and V) NOT DETECTED NOT DETECTED Final    Comment: Performed at [facility], [address], [place], [place] [address]         Radiology Studies: No results found.      Scheduled Meds:  allopurinol  200 mg Oral Daily   aspirin EC  81 mg Oral Daily   feeding supplement  237 mL Oral BID BM   Gerhardt's butt cream   Topical Daily   latanoprost  1 drop Both Eyes QHS   traZODone  50 mg Oral QHS   Continuous Infusions:   LOS: 6 days     Tresa Moore, MD [facility]   If 7PM-7AM, please contact night-coverage  07/24/2023, 1:24 PM

## 2023-07-24 NOTE — TOC Progression Note (Addendum)
 Transition of Care [facility]) - Progression Note    Patient Details  Name: Steven Jacobson MRN: [id number] Date of Birth: December 06, 1933  Transition of [facility] CM/SW Contact  Steven Jacobson, [place] Phone Number: 07/24/2023, 3:12 PM  Clinical Narrative:     Pt  did not meet criteria for Hospice Home. CSW met with pt and pt's son Steven Jacobson bedside to discuss alternative option for LTC at [facility] with hospice. Explained limitations of medicare coverage only covering short term rehab and not covering LTC. They would be interested in seeing if pt can get into SNF under is medicare for rehab. Fl2 completed and bed requests sent in hub. Treatment team notified. PMT had concerns about pt going to rehab just for financial reasons. PMT discussed alternative options with pt's Jacobson including trying other hospice facilities. CSW was present for the end of this discussion. Pt's son's confirmed they were interested in trying other hospice facilities instead of pursing rehab at [facility]. CSW sent referral to [facility] ([facility] and [place] locations).   1430: [facility] notified CSW that their MD doesn't feel pt meets criteria at this time. CSW  called [facility]([place] [facility]) to make referral. CSW faxed records to [phone]   CSW faxed LTC SNF request(CLC screening checklist) to [place] as backup plan.     Social Determinants of Health (SDOH) Interventions SDOH Screenings   Food Insecurity: No Food Insecurity (07/19/2023)  Housing: Low Risk  (07/19/2023)  Transportation Needs: Unmet Transportation Needs (07/19/2023)  Utilities: Not At Risk (07/19/2023)  Financial Resource Strain: Low Risk  (07/17/2023)  Physical Activity: Unknown (07/17/2023)  Social Connections: Socially Isolated (07/19/2023)  Stress: Stress Concern Present (07/17/2023)  Tobacco Use: Low Risk  (07/18/2023)    Readmission Risk Interventions     No data to display

## 2023-07-25 DIAGNOSIS — Z515 Encounter for palliative care: Secondary | ICD-10-CM

## 2023-07-25 DIAGNOSIS — I81 Portal vein thrombosis: Secondary | ICD-10-CM | POA: Diagnosis not present

## 2023-07-25 DIAGNOSIS — J189 Pneumonia, unspecified organism: Secondary | ICD-10-CM | POA: Diagnosis not present

## 2023-07-25 DIAGNOSIS — K55069 Acute infarction of intestine, part and extent unspecified: Secondary | ICD-10-CM | POA: Diagnosis not present

## 2023-07-25 MED ORDER — DIPHENHYDRAMINE HCL 12.5 MG/5ML PO ELIX: 12.5000 mg | ORAL_SOLUTION | Freq: Three times a day (TID) | ORAL | Status: DC | PRN

## 2023-07-25 NOTE — Discharge Summary (Signed)
 Physician Discharge Summary  Steven Jacobson NFA:[id number] DOB: 19-Jun-1933 DOA: 07/18/2023  PCP: [facility]  Admit date: 07/18/2023 Discharge date: 07/25/2023  Admitted From: Home Disposition:  Inpatient hospice unit  Recommendations for Outpatient Follow-up:  Per hospice providers   Home Health:NA  Equipment/Devices:None   Discharge Condition:Hospice  CODE STATUS:DNR  Diet recommendation: Comfort  Brief/Interim Summary:  89yo with h/o HTN, HLD, CAD, chronic diastolic CHF, gout, and class 1 obesity who presented on 2/28 with n/v and cough and was found to have sepsis due to multifocal PNA as well as an extensive nonocclusive thrombus of the portal and superior mesenteric veins and a L renal mass. He was started on Ceftriaxone and doxycycline with improvement in sepsis physiology. He was also started on heparin infusion with vascular surgery consultation (no other recommendations).    Patient not a candidate for any sort of intervention.  I attempted to restart heparin on 3/4.  Prior to initiation of heparin gtt. patient had frankly melenic stools and heparin was not initiated.  Discussion between patient, patient's son at bedside, palliative care decision made to proceed with full comfort measures and hospice referral.   3/6: Hospice referral requested.  Unfortunately patient was not approved for admission into hospice month.  TOC and palliative care working on alternative disposition plan   3/7: Accepted to [facility]    Discharge Diagnoses:  Principal Problem:   Multifocal pneumonia Active Problems:   Sepsis due to pneumonia (HCC)   Superior mesenteric vein thrombosis (HCC)   Portal vein thrombosis   Acute metabolic encephalopathy   CAD (coronary artery disease)   Essential hypertension   Chronic diastolic CHF (congestive heart failure) (HCC)   AKI (acute kidney injury) (HCC)   Renal mass   Thyroid nodule   SVT (supraventricular tachycardia) (HCC)   Obesity (BMI  30-39.9)   Medical orders for scope of treatment form in chart   Patient has been transition to full comfort measures after discussion with palliative care.  All medications not focused on patient comfort have been discontinued including antibiotics.     Accepted to [facility].  Will transfer on 3/7  Discharge Instructions  Discharge Instructions     Diet - low sodium heart healthy   Complete by: As directed    Increase activity slowly   Complete by: As directed    No wound care   Complete by: As directed       Allergies as of 07/25/2023       Reactions   Simvastatin Other (See Comments)   Muscle pain   Niacin Other (See Comments), Rash        Medication List     STOP taking these medications    aspirin 81 MG chewable tablet   atorvastatin 80 MG tablet Commonly known as: LIPITOR   clopidogrel 75 MG tablet Commonly known as: PLAVIX   lisinopril 40 MG tablet Commonly known as: ZESTRIL   metoprolol tartrate 100 MG tablet Commonly known as: LOPRESSOR       TAKE these medications    allopurinol 100 MG tablet Commonly known as: ZYLOPRIM Take 200 mg by mouth daily. To lower uric acid levels and prevent gout   latanoprost 0.005 % ophthalmic solution Commonly known as: XALATAN Place 1 drop into both eyes at bedtime.        Allergies  Allergen Reactions   Simvastatin Other (See Comments)    Muscle pain   Niacin Other (See Comments) and Rash    Consultations: Pall  care   Procedures/Studies: CT ANGIO GI BLEED Result Date: 07/21/2023 CLINICAL DATA:  Bloody stools while on heparin, initial encounter EXAM: CTA ABDOMEN AND PELVIS WITHOUT AND WITH CONTRAST TECHNIQUE: Multidetector CT imaging of the abdomen and pelvis was performed using the standard protocol during bolus administration of intravenous contrast. Multiplanar reconstructed images and MIPs were obtained and reviewed to evaluate the vascular anatomy. RADIATION DOSE REDUCTION: This exam was performed  according to the departmental dose-optimization program which includes automated exposure control, adjustment of the mA and/or kV according to patient size and/or use of iterative reconstruction technique. CONTRAST:  OMNIPAQUE IOHEXOL 350 MG/ML SOLN COMPARISON:  07/18/2023 FINDINGS: VASCULAR Aorta: Atherosclerotic calcifications are noted without aneurysmal dilatation or dissection. Celiac: Patent without evidence of aneurysm, dissection, vasculitis or significant stenosis. SMA: Patent without evidence of aneurysm, dissection, vasculitis or significant stenosis. Renals: 3 renal arteries are noted on the right. Single renal artery is noted on the left. No focal stenosis is seen. IMA: Patent without evidence of aneurysm, dissection, vasculitis or significant stenosis. Inflow: Iliac show atherosclerotic calcifications without complicating factors. Veins: Venous structures are within normal limits. Review of the MIP images confirms the above findings. NON-VASCULAR Lower chest: Lung bases demonstrate small bilateral pleural effusions. Patchy atelectatic changes are noted in the bases bilaterally. Hepatobiliary: Mild perihepatic ascites is noted. Multiple gallstones are noted within predominately decompressed gallbladder. Liver is within normal limits. Pancreas: Unremarkable. No pancreatic ductal dilatation or surrounding inflammatory changes. Spleen: Normal in size without focal abnormality. Adrenals/Urinary Tract: Adrenal glands are within normal limits. Kidneys are well visualized bilaterally within normal enhancement pattern. No renal calculi or obstructive changes are noted. Stable left renal cystic lesion is seen similar to that noted on the prior exam. Follow-up as previously described. No obstructive changes are seen. The bladder is well distended. Stomach/Bowel: Scattered diverticular change of the colon is noted. A defect is noted in the lateral abdominal wall on the left. The descending colon bulges into  this defect slightly. A small amount of ascites is also noted within the herniated pocket. The more proximal colon appears within normal limits. The appendix is within normal limits. Small bowel and stomach are unremarkable. No pooling of contrast or active extravasation is identified to suggest active GI hemorrhage. Lymphatic: Persistent superior mesenteric and portal venous thrombus is noted. This extends into the left portal vein within the liver. Atherosclerotic calcifications of aorta are noted. No lymphadenopathy is seen. Reproductive: Prostate is enlarged in size. Other: Mild ascites is noted. Mesenteric inflammatory changes noted related to the superior mesenteric vein thrombus. Musculoskeletal: No acute bony abnormality is noted. IMPRESSION: VASCULAR Persistent thrombus within main and left portal vein as well as the superior mesenteric vein. No evidence of active GI hemorrhage. NON-VASCULAR Cholelithiasis without complicating factors. Mild ascites. Diverticular change without diverticulitis. Prostatic enlargement. Stable left indeterminate renal lesion. Follow-up as previously described. Small pleural effusions with associated patchy atelectatic changes. Electronically Signed   By: Alcide Clever M.D.   On: 07/21/2023 02:46   CT CHEST ABDOMEN PELVIS W CONTRAST Addendum Date: 07/18/2023 ADDENDUM REPORT: 07/18/2023 19:19 ADDENDUM: These results were called by telephone at the time of interpretation on 07/18/2023 at 7:19 pm to provider DAVID WELLS , who verbally acknowledged these results. Electronically Signed   By: Tish Frederickson M.D.   On: 07/18/2023 19:19   Result Date: 07/18/2023 CLINICAL DATA:  Altered mental status, septic, UA negative. Nausea vomiting and diarrhea. Questionable pneumonia on chest x-ray and further imaging to rule out intrathoracic or  intra-abdominal infection source EXAM: CT CHEST, ABDOMEN, AND PELVIS WITH CONTRAST TECHNIQUE: Multidetector CT imaging of the chest, abdomen and  pelvis was performed following the standard protocol during bolus administration of intravenous contrast. RADIATION DOSE REDUCTION: This exam was performed according to the departmental dose-optimization program which includes automated exposure control, adjustment of the mA and/or kV according to patient size and/or use of iterative reconstruction technique. CONTRAST:  80mL OMNIPAQUE IOHEXOL 350 MG/ML SOLN COMPARISON:  None Available. FINDINGS: CT CHEST FINDINGS Cardiovascular: Normal heart size. No significant pericardial effusion. The thoracic aorta is normal in caliber. Severe atherosclerotic plaque of the thoracic aorta. Four-vessel coronary artery calcifications. Aortic valve leaflet calcification. Mediastinum/Nodes: No enlarged mediastinal, hilar, or axillary lymph nodes. Trachea esophagus demonstrate no significant findings. 1.9 cm right thyroid gland hypodense nodule. Small hiatal hernia. Lungs/Pleura: Bilateral lower lobes patchy heterogeneous nonenhancing airspace opacities. Left lower lobe peribronchovascular patchy airspace opacities. Bronchial wall thickening. No pulmonary nodule. No pulmonary mass. No pleural effusion. No pneumothorax. Musculoskeletal: No chest wall abnormality.  Sternotomy wires are intact. No suspicious lytic or blastic osseous lesions. No acute displaced fracture. CT ABDOMEN PELVIS FINDINGS Hepatobiliary: No focal liver abnormality. Calcified gallstone noted within the gallbladder lumen. No gallbladder wall thickening or pericholecystic fluid. No biliary dilatation. Pancreas: No focal lesion. Normal pancreatic contour. No surrounding inflammatory changes. No main pancreatic ductal dilatation. Spleen: Normal in size without focal abnormality. Adrenals/Urinary Tract: No adrenal nodule bilaterally. Bilateral kidneys enhance symmetrically. There is a 4.3 cm left renal homogeneously hypodense lesion with a density of 41 Hounsfield units. No hydronephrosis. No hydroureter. The urinary  bladder demonstrates multiple diverticula. Stomach/Bowel: Stomach is within normal limits. No evidence of bowel wall thickening or dilatation. Colonic diverticulosis. Appendix appears normal. 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 No abdominal aorta or iliac aneurysm. Severe atherosclerotic plaque of the aorta and its branches. No abdominal, pelvic, or inguinal lymphadenopathy. Reproductive: Prostatomegaly with prostate measuring up to 7.3 cm. Other: Small bowel mesentery fat stranding. No intraperitoneal free fluid. No intraperitoneal free gas. No organized fluid collection. Musculoskeletal: No abdominal wall hernia or abnormality. No suspicious lytic or blastic osseous lesions. No acute displaced fracture. Bilateral at least mild hip degenerative changes. Posterior disc osteophyte complex formation at the L4-L5 level. Disc bulge at the L3-L4 L4-L5 levels. IMPRESSION: 1. 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 Associated small bowel misty mesentery. 2. Bilateral lower lobe multifocal pneumonia. 3. Small hiatal hernia. 4. Left renal mass measuring 4.3 cm, indeterminate due to density. Correlate with prior cross-sectional imaging if available. If not available, when the patient is clinically stable and able to follow directions and hold their breath (preferably as an outpatient) further evaluation with dedicated MRI renal protocol should be considered. 5. Cholelithiasis with no CT evidence of acute cholecystitis. 6. Colonic diverticulosis with no acute diverticulitis. 7.  Prostatomegaly with suggestion of obstructive uropathy. 8. Aortic Atherosclerosis (ICD10-I70.0) -severe, including four-vessel coronary artery and aortic valve leaflet calcifications-correlate for aortic stenosis. 9. Incidental right thyroid nodule measuring 1.9 cm. Recommend non-emergent thyroid ultrasound if clinically warranted given patient age.  Reference: J Am Coll Radiol. 2015 Feb;12(2): 143-50. Electronically Signed: By: Tish Frederickson M.D. On: 07/18/2023 19:16   CT Head Wo Contrast Result Date: 07/18/2023 CLINICAL DATA:  Altered mental status. EXAM: CT HEAD WITHOUT CONTRAST TECHNIQUE: Contiguous axial images were obtained from the base of the skull through the vertex without intravenous contrast. RADIATION DOSE REDUCTION: This exam was performed according to the departmental  dose-optimization program which includes automated exposure control, adjustment of the mA and/or kV according to patient size and/or use of iterative reconstruction technique. COMPARISON:  Head CT dated 06/22/2023. FINDINGS: Brain: Moderate age-related atrophy and chronic microvascular ischemic changes. There is no acute intracranial hemorrhage. No mass effect or midline shift. No extra-axial fluid collection. Vascular: No hyperdense vessel or unexpected calcification. Skull: Normal. Negative for fracture or focal lesion. Sinuses/Orbits: Mild mucoperiosteal thickening of paranasal sinuses. No air-fluid level. The mastoid air cells are clear Other: None IMPRESSION: 1. No acute intracranial pathology. 2. Moderate age-related atrophy and chronic microvascular ischemic changes. Electronically Signed   By: Elgie Collard M.D.   On: 07/18/2023 18:14   DG Chest Portable 1 View Result Date: 07/18/2023 CLINICAL DATA:  Shortness of breath. Hypoxia. Altered mental status EXAM: PORTABLE CHEST 1 VIEW COMPARISON:  X-ray 06/22/2023 FINDINGS: Status post median sternotomy. Enlarged cardiopericardial silhouette calcified tortuous aorta. Prominent central vasculature. Film is rotated to left. Film is underinflation. There is opacity at the left lung base. Possible infiltrate. Tiny left effusion is possible. No pneumothorax or edema. IMPRESSION: Underinflated rotated radiograph. Enlarged heart with calcified tortuous aorta. Prominent central vasculature. Focal opacity at the left lung base  with tiny effusion. Infiltrate is possible. Recommend follow-up Electronically Signed   By: Karen Kays M.D.   On: 07/18/2023 18:07      Subjective: Seen and examined on day of dc.  Appropriate for transfer to inpatient hospice unit.  Discharge Exam: Vitals:   07/25/23 0352 07/25/23 0728  BP: 115/72 115/71  Pulse: 88 89  Resp: 18 16  Temp: 98 F (36.7 C) 98.1 F (36.7 C)  SpO2: 98% 97%   Vitals:   07/24/23 0732 07/24/23 2010 07/25/23 0352 07/25/23 0728  BP: (!) 145/84 123/60 115/72 115/71  Pulse: (!) 58 90 88 89  Resp: 18 16 18 16   Temp: (!) 97.5 F (36.4 C) 98 F (36.7 C) 98 F (36.7 C) 98.1 F (36.7 C)  TempSrc:      SpO2: 94% 100% 98% 97%  Weight:      Height:        General: Pt is alert, awake, not in acute distress Cardiovascular: RRR, S1/S2 +, no rubs, no gallops Respiratory: CTA bilaterally, no wheezing, no rhonchi Abdominal: Soft, NT, ND, bowel sounds + Extremities: no edema, no cyanosis    The results of significant diagnostics from this hospitalization (including imaging, microbiology, ancillary and laboratory) are listed below for reference.     Microbiology: Recent Results (from the past 240 hours)  Blood Culture (routine x 2)     Status: None   Collection Time: 07/18/23  5:05 PM   Specimen: BLOOD LEFT ARM  Result Value Ref Range Status   Specimen Description BLOOD LEFT ARM  Final   Special Requests   Final    BOTTLES DRAWN AEROBIC AND ANAEROBIC Blood Culture results may not be optimal due to an inadequate volume of blood received in culture bottles   Culture   Final    NO GROWTH 5 DAYS Performed at [facility], [address] [place], [place] [address]    Report Status 07/23/2023 FINAL  Final  Blood Culture (routine x 2)     Status: None   Collection Time: 07/18/23  5:05 PM   Specimen: BLOOD RIGHT ARM  Result Value Ref Range Status   Specimen Description BLOOD RIGHT ARM  Final   Special Requests   Final    BOTTLES DRAWN  AEROBIC AND  ANAEROBIC Blood Culture results may not be optimal due to an inadequate volume of blood received in culture bottles   Culture   Final    NO GROWTH 5 DAYS Performed at [facility], [address] [place], [place] [address]    Report Status 07/23/2023 FINAL  Final  Resp panel by RT-PCR (RSV, Flu A&B, Covid) Urine, Catheterized     Status: None   Collection Time: 07/18/23  5:19 PM   Specimen: Urine, Catheterized; Nasal Swab  Result Value Ref Range Status   SARS Coronavirus 2 by RT PCR NEGATIVE NEGATIVE Final    Comment: (NOTE) SARS-CoV-2 target nucleic acids are NOT DETECTED.  The SARS-CoV-2 RNA is generally detectable in upper respiratory specimens during the acute phase of infection. The lowest concentration of SARS-CoV-2 viral copies this assay can detect is 138 copies/mL. A negative result does not preclude SARS-Cov-2 infection and should not be used as the sole basis for treatment or other patient management decisions. A negative result may occur with  improper specimen collection/handling, submission of specimen other than nasopharyngeal swab, presence of viral mutation(s) within the areas targeted by this assay, and inadequate number of viral copies(<138 copies/mL). A negative result must be combined with clinical observations, patient history, and epidemiological information. The expected result is Negative.  Fact Sheet for Patients:  BloggerCourse.com  Fact Sheet for Healthcare Providers:  SeriousBroker.it  This test is no t yet approved or cleared by the [place] FDA and  has been authorized for detection and/or diagnosis of SARS-CoV-2 by FDA under an Emergency Use Authorization (EUA). This EUA will remain  in effect (meaning this test can be used) for the duration of the COVID-19 declaration under Section 564(b)(1) of the Act, 21 U.S.C.section 360bbb-3(b)(1), unless the authorization is  terminated  or revoked sooner.       Influenza A by PCR NEGATIVE NEGATIVE Final   Influenza B by PCR NEGATIVE NEGATIVE Final    Comment: (NOTE) The Xpert Xpress SARS-CoV-2/FLU/RSV plus assay is intended as an aid in the diagnosis of influenza from Nasopharyngeal swab specimens and should not be used as a sole basis for treatment. Nasal washings and aspirates are unacceptable for Xpert Xpress SARS-CoV-2/FLU/RSV testing.  Fact Sheet for Patients: BloggerCourse.com  Fact Sheet for Healthcare Providers: SeriousBroker.it  This test is not yet approved or cleared by the [place] FDA and has been authorized for detection and/or diagnosis of SARS-CoV-2 by FDA under an Emergency Use Authorization (EUA). This EUA will remain in effect (meaning this test can be used) for the duration of the COVID-19 declaration under Section 564(b)(1) of the Act, 21 U.S.C. section 360bbb-3(b)(1), unless the authorization is terminated or revoked.     Resp Syncytial Virus by PCR NEGATIVE NEGATIVE Final    Comment: (NOTE) Fact Sheet for Patients: BloggerCourse.com  Fact Sheet for Healthcare Providers: SeriousBroker.it  This test is not yet approved or cleared by the [place] FDA and has been authorized for detection and/or diagnosis of SARS-CoV-2 by FDA under an Emergency Use Authorization (EUA). This EUA will remain in effect (meaning this test can be used) for the duration of the COVID-19 declaration under Section 564(b)(1) of the Act, 21 U.S.C. section 360bbb-3(b)(1), unless the authorization is terminated or revoked.  Performed at [facility], [address] [place], [place] [address]   Expectorated Sputum Assessment w Gram Stain, Rflx to Resp Cult     Status: None   Collection Time: 07/19/23 10:21 PM   Specimen: Expectorated  Sputum  Result Value Ref Range Status    Specimen Description EXPECTORATED SPUTUM  Final   Special Requests NONE  Final   Sputum evaluation   Final    THIS SPECIMEN IS ACCEPTABLE FOR SPUTUM CULTURE Performed at [facility], [address] [place], [place] [address]    Report Status 07/19/2023 FINAL  Final  Culture, Respiratory w Gram Stain     Status: None   Collection Time: 07/19/23 10:21 PM  Result Value Ref Range Status   Specimen Description   Final    EXPECTORATED SPUTUM Performed at [facility], [address] [place], [place] [address]    Special Requests   Final    NONE Reflexed from [id number] Performed at [facility], [address] [place], [place] [address]    Gram Stain   Final    MODERATE WBC PRESENT,BOTH PMN AND MONONUCLEAR FEW GRAM POSITIVE COCCI IN CHAINS FEW YEAST WITH PSEUDOHYPHAE Performed at [facility], [address], [place], [place] [address]    Culture MODERATE CANDIDA ALBICANS  Final   Report Status 07/22/2023 FINAL  Final  C Difficile Quick Screen w PCR reflex     Status: None   Collection Time: 07/20/23 10:33 AM   Specimen: STOOL  Result Value Ref Range Status   C Diff antigen NEGATIVE NEGATIVE Final   C Diff toxin NEGATIVE NEGATIVE Final   C Diff interpretation No C. difficile detected.  Final    Comment: Performed at [facility], [address] [place], [place] [address]  Gastrointestinal Panel by PCR , Stool     Status: None   Collection Time: 07/20/23 10:33 AM   Specimen: STOOL  Result Value Ref Range Status   Campylobacter species NOT DETECTED NOT DETECTED Final   Plesimonas shigelloides NOT DETECTED NOT DETECTED Final   Salmonella species NOT DETECTED NOT DETECTED Final   Yersinia enterocolitica NOT DETECTED NOT DETECTED Final   Vibrio species NOT DETECTED NOT DETECTED Final   Vibrio cholerae NOT DETECTED NOT DETECTED Final   Enteroaggregative E coli (EAEC) NOT DETECTED NOT DETECTED Final   Enteropathogenic E coli (EPEC) NOT  DETECTED NOT DETECTED Final   Enterotoxigenic E coli (ETEC) NOT DETECTED NOT DETECTED Final   Shiga like toxin producing E coli (STEC) NOT DETECTED NOT DETECTED Final   Shigella/Enteroinvasive E coli (EIEC) NOT DETECTED NOT DETECTED Final   Cryptosporidium NOT DETECTED NOT DETECTED Final   Cyclospora cayetanensis NOT DETECTED NOT DETECTED Final   Entamoeba histolytica NOT DETECTED NOT DETECTED Final   Giardia lamblia NOT DETECTED NOT DETECTED Final   Adenovirus F40/41 NOT DETECTED NOT DETECTED Final   Astrovirus NOT DETECTED NOT DETECTED Final   Norovirus GI/GII NOT DETECTED NOT DETECTED Final   Rotavirus A NOT DETECTED NOT DETECTED Final   Sapovirus (I, II, IV, and V) NOT DETECTED NOT DETECTED Final    Comment: Performed at [facility], [address] [place], [place] [address]     Labs: BNP (last 3 results) Recent Labs    07/18/23 1719  BNP 78.9   Basic Metabolic Panel: Recent Labs  Lab 07/18/23 1719 07/19/23 0308 07/20/23 1014 07/21/23 0524 07/22/23 0455 07/23/23 0501  NA 133* 134* 135 134* 132* 134*  K 4.5 4.1 3.8 4.2 3.9 4.2  CL 101 106 108 112* 106 109  CO2 20* 17* 20* 18* 18* 20*  GLUCOSE 122* 120* 109* 99 99 87  BUN 54* 44* 45* 57* 46* 31*  CREATININE  1.67* 1.26* 1.18 1.07 1.00 0.92  CALCIUM 8.1* 7.3* 7.5* 7.5* 7.4* 7.5*  MG 2.4  --   --   --   --   --    Liver Function Tests: Recent Labs  Lab 07/18/23 1719 07/19/23 0308  AST 44* 42*  ALT 33 26  ALKPHOS 117 108  BILITOT 1.9* 1.4*  PROT 6.8 5.7*  ALBUMIN 2.1* 1.8*   Recent Labs  Lab 07/18/23 1719  LIPASE 52*   Recent Labs  Lab 07/19/23 0308  AMMONIA 34   CBC: Recent Labs  Lab 07/21/23 0524 07/21/23 1817 07/22/23 0455 07/22/23 1751 07/23/23 0501  WBC 23.2* 23.6* 20.7* 24.7* 20.0*  NEUTROABS 18.1* 17.7* 15.9* 18.7* 14.8*  HGB 8.9* 8.8* 8.4* 8.8* 8.7*  HCT 26.8* 26.8* 25.3* 27.6* 26.0*  MCV 86.7 89.9 88.2 91.4 90.0  PLT 189 199 191 208 194   Cardiac Enzymes: Recent Labs   Lab 07/18/23 1719  CKTOTAL 49   BNP: Invalid input(s): "POCBNP" CBG: Recent Labs  Lab 07/20/23 0659  GLUCAP 110*   D-Dimer No results for input(s): "DDIMER" in the last 72 hours. Hgb A1c No results for input(s): "HGBA1C" in the last 72 hours. Lipid Profile No results for input(s): "CHOL", "HDL", "LDLCALC", "TRIG", "CHOLHDL", "LDLDIRECT" in the last 72 hours. Thyroid function studies No results for input(s): "TSH", "T4TOTAL", "T3FREE", "THYROIDAB" in the last 72 hours.  Invalid input(s): "FREET3" Anemia work up No results for input(s): "VITAMINB12", "FOLATE", "FERRITIN", "TIBC", "IRON", "RETICCTPCT" in the last 72 hours. Urinalysis    Component Value Date/Time   COLORURINE AMBER (A) 07/18/2023 1723   APPEARANCEUR CLOUDY (A) 07/18/2023 1723   LABSPEC 1.018 07/18/2023 1723   PHURINE 5.0 07/18/2023 1723   GLUCOSEU NEGATIVE 07/18/2023 1723   HGBUR LARGE (A) 07/18/2023 1723   BILIRUBINUR NEGATIVE 07/18/2023 1723   KETONESUR NEGATIVE 07/18/2023 1723   PROTEINUR 100 (A) 07/18/2023 1723   NITRITE NEGATIVE 07/18/2023 1723   LEUKOCYTESUR NEGATIVE 07/18/2023 1723   Sepsis Labs Recent Labs  Lab 07/21/23 1817 07/22/23 0455 07/22/23 1751 07/23/23 0501  WBC 23.6* 20.7* 24.7* 20.0*   Microbiology Recent Results (from the past 240 hours)  Blood Culture (routine x 2)     Status: None   Collection Time: 07/18/23  5:05 PM   Specimen: BLOOD LEFT ARM  Result Value Ref Range Status   Specimen Description BLOOD LEFT ARM  Final   Special Requests   Final    BOTTLES DRAWN AEROBIC AND ANAEROBIC Blood Culture results may not be optimal due to an inadequate volume of blood received in culture bottles   Culture   Final    NO GROWTH 5 DAYS Performed at [facility], [address] [place], [place] [address]    Report Status 07/23/2023 FINAL  Final  Blood Culture (routine x 2)     Status: None   Collection Time: 07/18/23  5:05 PM   Specimen: BLOOD RIGHT ARM  Result Value  Ref Range Status   Specimen Description BLOOD RIGHT ARM  Final   Special Requests   Final    BOTTLES DRAWN AEROBIC AND ANAEROBIC Blood Culture results may not be optimal due to an inadequate volume of blood received in culture bottles   Culture   Final    NO GROWTH 5 DAYS Performed at [facility], [address] [place], [place] [address]    Report Status 07/23/2023 FINAL  Final  Resp panel by RT-PCR (RSV, Flu A&B, Covid) Urine, Catheterized  Status: None   Collection Time: 07/18/23  5:19 PM   Specimen: Urine, Catheterized; Nasal Swab  Result Value Ref Range Status   SARS Coronavirus 2 by RT PCR NEGATIVE NEGATIVE Final    Comment: (NOTE) SARS-CoV-2 target nucleic acids are NOT DETECTED.  The SARS-CoV-2 RNA is generally detectable in upper respiratory specimens during the acute phase of infection. The lowest concentration of SARS-CoV-2 viral copies this assay can detect is 138 copies/mL. A negative result does not preclude SARS-Cov-2 infection and should not be used as the sole basis for treatment or other patient management decisions. A negative result may occur with  improper specimen collection/handling, submission of specimen other than nasopharyngeal swab, presence of viral mutation(s) within the areas targeted by this assay, and inadequate number of viral copies(<138 copies/mL). A negative result must be combined with clinical observations, patient history, and epidemiological information. The expected result is Negative.  Fact Sheet for Patients:  BloggerCourse.com  Fact Sheet for Healthcare Providers:  SeriousBroker.it  This test is no t yet approved or cleared by the [place] FDA and  has been authorized for detection and/or diagnosis of SARS-CoV-2 by FDA under an Emergency Use Authorization (EUA). This EUA will remain  in effect (meaning this test can be used) for the duration of the COVID-19  declaration under Section 564(b)(1) of the Act, 21 U.S.C.section 360bbb-3(b)(1), unless the authorization is terminated  or revoked sooner.       Influenza A by PCR NEGATIVE NEGATIVE Final   Influenza B by PCR NEGATIVE NEGATIVE Final    Comment: (NOTE) The Xpert Xpress SARS-CoV-2/FLU/RSV plus assay is intended as an aid in the diagnosis of influenza from Nasopharyngeal swab specimens and should not be used as a sole basis for treatment. Nasal washings and aspirates are unacceptable for Xpert Xpress SARS-CoV-2/FLU/RSV testing.  Fact Sheet for Patients: BloggerCourse.com  Fact Sheet for Healthcare Providers: SeriousBroker.it  This test is not yet approved or cleared by the [place] FDA and has been authorized for detection and/or diagnosis of SARS-CoV-2 by FDA under an Emergency Use Authorization (EUA). This EUA will remain in effect (meaning this test can be used) for the duration of the COVID-19 declaration under Section 564(b)(1) of the Act, 21 U.S.C. section 360bbb-3(b)(1), unless the authorization is terminated or revoked.     Resp Syncytial Virus by PCR NEGATIVE NEGATIVE Final    Comment: (NOTE) Fact Sheet for Patients: BloggerCourse.com  Fact Sheet for Healthcare Providers: SeriousBroker.it  This test is not yet approved or cleared by the [place] FDA and has been authorized for detection and/or diagnosis of SARS-CoV-2 by FDA under an Emergency Use Authorization (EUA). This EUA will remain in effect (meaning this test can be used) for the duration of the COVID-19 declaration under Section 564(b)(1) of the Act, 21 U.S.C. section 360bbb-3(b)(1), unless the authorization is terminated or revoked.  Performed at [facility], [address] [place], [place] [address]   Expectorated Sputum Assessment w Gram Stain, Rflx to Resp Cult     Status: None    Collection Time: 07/19/23 10:21 PM   Specimen: Expectorated Sputum  Result Value Ref Range Status   Specimen Description EXPECTORATED SPUTUM  Final   Special Requests NONE  Final   Sputum evaluation   Final    THIS SPECIMEN IS ACCEPTABLE FOR SPUTUM CULTURE Performed at [facility], [address] [place], [place] [address]    Report Status 07/19/2023 FINAL  Final  Culture, Respiratory w Gram Stain  Status: None   Collection Time: 07/19/23 10:21 PM  Result Value Ref Range Status   Specimen Description   Final    EXPECTORATED SPUTUM Performed at [facility], [address] [place], [place] [address]    Special Requests   Final    NONE Reflexed from [id number] Performed at [facility], [address] [place], [place] [address]    Gram Stain   Final    MODERATE WBC PRESENT,BOTH PMN AND MONONUCLEAR FEW GRAM POSITIVE COCCI IN CHAINS FEW YEAST WITH PSEUDOHYPHAE Performed at [facility], [address], [place], [place] [address]    Culture MODERATE CANDIDA ALBICANS  Final   Report Status 07/22/2023 FINAL  Final  C Difficile Quick Screen w PCR reflex     Status: None   Collection Time: 07/20/23 10:33 AM   Specimen: STOOL  Result Value Ref Range Status   C Diff antigen NEGATIVE NEGATIVE Final   C Diff toxin NEGATIVE NEGATIVE Final   C Diff interpretation No C. difficile detected.  Final    Comment: Performed at [facility], [address] [place], [place] [address]  Gastrointestinal Panel by PCR , Stool     Status: None   Collection Time: 07/20/23 10:33 AM   Specimen: STOOL  Result Value Ref Range Status   Campylobacter species NOT DETECTED NOT DETECTED Final   Plesimonas shigelloides NOT DETECTED NOT DETECTED Final   Salmonella species NOT DETECTED NOT DETECTED Final   Yersinia enterocolitica NOT DETECTED NOT DETECTED Final   Vibrio species NOT DETECTED NOT DETECTED Final   Vibrio cholerae NOT DETECTED NOT DETECTED Final    Enteroaggregative E coli (EAEC) NOT DETECTED NOT DETECTED Final   Enteropathogenic E coli (EPEC) NOT DETECTED NOT DETECTED Final   Enterotoxigenic E coli (ETEC) NOT DETECTED NOT DETECTED Final   Shiga like toxin producing E coli (STEC) NOT DETECTED NOT DETECTED Final   Shigella/Enteroinvasive E coli (EIEC) NOT DETECTED NOT DETECTED Final   Cryptosporidium NOT DETECTED NOT DETECTED Final   Cyclospora cayetanensis NOT DETECTED NOT DETECTED Final   Entamoeba histolytica NOT DETECTED NOT DETECTED Final   Giardia lamblia NOT DETECTED NOT DETECTED Final   Adenovirus F40/41 NOT DETECTED NOT DETECTED Final   Astrovirus NOT DETECTED NOT DETECTED Final   Norovirus GI/GII NOT DETECTED NOT DETECTED Final   Rotavirus A NOT DETECTED NOT DETECTED Final   Sapovirus (I, II, IV, and V) NOT DETECTED NOT DETECTED Final    Comment: Performed at [facility], [address], [place], [place] [address]     Time coordinating discharge: Over 30 minutes  SIGNED:   Tresa Moore, MD  [facility] 07/25/2023, 11:13 AM Pager   If 7PM-7AM, please contact night-coverage

## 2023-07-25 NOTE — Progress Notes (Signed)
                                                                                                                                                                                                           Daily Progress Note   Patient Name: Steven Jacobson      Date: 07/25/2023 DOB: 08-03-1933  Age: 89 y.o. MRN#: [id number] Attending Physician: Tresa Moore, MD Primary Care Physician: [facility] Admit Date: 07/18/2023  Reason for Consultation/Follow-up: Establishing goals of care  HPI/Brief Hospital Review:  Adonis Yim is a 89 y.o. male with medical history significant of HTN, HLD, CAD, dCHF, gout, BPH, SVT, UTI, obesity, who presents with nausea, vomiting cough. 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  Not a candidate for any intervention for thrombus. Being treated with antibiotics and heparin infusion.  Transitioned to full comfort measures 3/5-disposition has been pending.  PMT consulted to assist with goals of care conversations.   Subjective: Extensive chart review has been completed prior to meeting patient including labs, vital signs, imaging, progress notes, orders, and available advanced directive documents from current and previous encounters.    Visited with Mr. Yeary at his bedside. He is awake, alert, able to engage in conversation. Son at bedside.  Assessed symptoms, Mr. Moten denies acute pain or discomfort, sleeping well, intake remains poor. Review of MAR, no recommendations to add/adjust current regimen.  According [place] to assess for acceptance later today. Plan for LTC with hospice services following.  From chart review, discharge orders in place as Mr. Comp has been accepted to [facility].   Palliative Care Assessment & Plan   Assessment/Recommendation/Plan  DNR/DNI/Comfort  Care plan was discussed  with primary team.  Thank you for allowing the Palliative Medicine Team to assist in the care of this patient.  Total time:  35 minutes  Time spent includes: Detailed review of medical records (labs, imaging, vital signs), medically appropriate exam (mental status, respiratory, cardiac, skin), discussed with treatment team, counseling and educating patient, family and staff, documenting clinical information, medication management and coordination of care.  Leeanne Deed, DNP, AGNP-C Palliative Medicine   Please contact Palliative Medicine Team phone at [phone] for questions and concerns.

## 2023-07-25 NOTE — TOC Progression Note (Signed)
 Transition of Care [facility]) - Progression Note    Patient Details  Name: Steven Jacobson MRN: [id number] Date of Birth: 09-Dec-1933  Transition of Care [facility]) CM/SW Contact  Erin Sons, [place] Phone Number: 07/25/2023, 9:05 AM  Clinical Narrative:      [facility] will have a nurse come assess pt in person between 10-1030am today.   CSW updated pt's son.                        Social Determinants of Health (SDOH) Interventions SDOH Screenings   Food Insecurity: No Food Insecurity (07/19/2023)  Housing: Low Risk  (07/19/2023)  Transportation Needs: Unmet Transportation Needs (07/19/2023)  Utilities: Not At Risk (07/19/2023)  Financial Resource Strain: Low Risk  (07/17/2023)  Physical Activity: Unknown (07/17/2023)  Social Connections: Socially Isolated (07/19/2023)  Stress: Stress Concern Present (07/17/2023)  Tobacco Use: Low Risk  (07/18/2023)    Readmission Risk Interventions     No data to display

## 2023-07-25 NOTE — TOC Transition Note (Signed)
 Transition of Care [facility]) - Discharge Note   Patient Details  Name: Steven Jacobson MRN: [id number] Date of Birth: [birth year]  Transition of Care [facility]) CM/SW Contact:  Erin Sons, LCSW Phone Number: 07/25/2023, 1:51 PM   Clinical Narrative:     Pt accepted at [facility] facility.  Per MD patient ready for DC to [facility]. RN, patient, patient's family, and facility notified of DC. Discharge Summary sent to facility. RN to call report prior to discharge [phone]). DC packet on chart. Ambulance transport requested for patient.   CSW will sign off for now as social work intervention is no longer needed. Please consult [place] again if new needs arise.   Final next level of care: Hospice Medical Facility Barriers to Discharge: No Barriers Identified         Discharge Placement              Patient chooses bed at:  [facility]) Patient to be transferred to facility by: [facility] Name of family member notified: Son Patient and family notified of of transfer: 07/25/23  Discharge Plan and Services Additional resources added to the After Visit Summary for                                       Social Drivers of Health (SDOH) Interventions SDOH Screenings   Food Insecurity: No Food Insecurity (07/19/2023)  Housing: Low Risk  (07/19/2023)  Transportation Needs: Unmet Transportation Needs (07/19/2023)  Utilities: Not At Risk (07/19/2023)  Financial Resource Strain: Low Risk  (07/17/2023)  Physical Activity: Unknown (07/17/2023)  Social Connections: Socially Isolated (07/19/2023)  Stress: Stress Concern Present (07/17/2023)  Tobacco Use: Low Risk  (07/18/2023)     Readmission Risk Interventions     No data to display

## 2023-07-25 NOTE — Plan of Care (Signed)
  Problem: Health Behavior/Discharge Planning: Goal: Ability to manage health-related needs will improve Outcome: Progressing   Problem: Nutrition: Goal: Adequate nutrition will be maintained Outcome: Progressing   Problem: Coping: Goal: Level of anxiety will decrease Outcome: Progressing   Problem: Elimination: Goal: Will not experience complications related to bowel motility Outcome: Progressing Goal: Will not experience complications related to urinary retention Outcome: Progressing   Problem: Safety: Goal: Ability to remain free from injury will improve Outcome: Progressing   Problem: Skin Integrity: Goal: Risk for impaired skin integrity will decrease Outcome: Progressing   Problem: Activity: Goal: Ability to tolerate increased activity will improve Outcome: Progressing   Problem: Clinical Measurements: Goal: Ability to maintain a body temperature in the normal range will improve Outcome: Progressing   Problem: Respiratory: Goal: Ability to maintain adequate ventilation will improve Outcome: Progressing Goal: Ability to maintain a clear airway will improve Outcome: Progressing

## 2023-07-25 NOTE — Progress Notes (Signed)
.  PHARMACIST - PHYSICIAN COMMUNICATION  DR:   Georgeann Oppenheim CONCERNING: IV to Oral Route Change Policy  RECOMMENDATION: This patient is receiving diphenhydramine by the intravenous route.  Based on criteria approved by the Pharmacy and Therapeutics Committee, intravenous diphenhydramine is being converted to the equivalent oral dose form(s).   DESCRIPTION: These criteria include: Diphenhydramine is not prescribed to treat or prevent a severe allergic reaction Diphenhydramine is not prescribed as premedication prior to receiving blood product, biologic medication, antimicrobial, or chemotherapy agent The patient has tolerated at least one dose of an oral or enteral medication The patient has no evidence of active gastrointestinal bleeding or impaired GI absorption (gastrectomy, short bowel, patient on TNA or NPO). The patient is not undergoing procedural sedation   If you have questions about this conversion, please contact the Pharmacy Department  []   [phone] )  [facility] [x]   [phone] )  [facility] []   [phone] )  [facility] []   [phone] )  [facility] []   [phone] )  [facility]
